# Patient Record
Sex: Female | Born: 1950 | Race: White | Hispanic: No | Marital: Married | Smoking: Former smoker
Health system: Southern US, Community
[De-identification: ages and names within clinical notes are randomized; demographics above are authoritative.]

## PROBLEM LIST (undated history)

## (undated) DIAGNOSIS — K219 Gastro-esophageal reflux disease without esophagitis: Secondary | ICD-10-CM

## (undated) DIAGNOSIS — E119 Type 2 diabetes mellitus without complications: Secondary | ICD-10-CM

## (undated) DIAGNOSIS — E039 Hypothyroidism, unspecified: Secondary | ICD-10-CM

## (undated) DIAGNOSIS — C4499 Other specified malignant neoplasm of skin, unspecified: Secondary | ICD-10-CM

## (undated) HISTORY — PX: WRIST SURGERY: SHX841

## (undated) HISTORY — PX: CATARACT EXTRACTION, BILATERAL: SHX1313

## (undated) HISTORY — PX: OTHER SURGICAL HISTORY: SHX169

---

## 1999-06-24 ENCOUNTER — Ambulatory Visit (HOSPITAL_COMMUNITY): Admission: RE | Admit: 1999-06-24 | Discharge: 1999-06-24 | Payer: Self-pay | Admitting: Otolaryngology

## 1999-06-24 ENCOUNTER — Encounter: Payer: Self-pay | Admitting: Otolaryngology

## 1999-09-01 ENCOUNTER — Encounter: Payer: Self-pay | Admitting: *Deleted

## 1999-09-01 ENCOUNTER — Encounter: Admission: RE | Admit: 1999-09-01 | Discharge: 1999-09-01 | Payer: Self-pay | Admitting: *Deleted

## 2000-07-25 ENCOUNTER — Encounter: Admission: RE | Admit: 2000-07-25 | Discharge: 2000-07-25 | Payer: Self-pay | Admitting: Internal Medicine

## 2000-07-25 ENCOUNTER — Encounter: Payer: Self-pay | Admitting: Internal Medicine

## 2000-09-01 ENCOUNTER — Encounter: Payer: Self-pay | Admitting: Family Medicine

## 2000-09-01 ENCOUNTER — Encounter: Admission: RE | Admit: 2000-09-01 | Discharge: 2000-09-01 | Payer: Self-pay | Admitting: Family Medicine

## 2001-07-26 ENCOUNTER — Encounter: Admission: RE | Admit: 2001-07-26 | Discharge: 2001-07-26 | Payer: Self-pay | Admitting: Internal Medicine

## 2001-07-26 ENCOUNTER — Encounter: Payer: Self-pay | Admitting: Internal Medicine

## 2002-10-05 ENCOUNTER — Encounter: Admission: RE | Admit: 2002-10-05 | Discharge: 2002-10-05 | Payer: Self-pay | Admitting: Internal Medicine

## 2002-10-05 ENCOUNTER — Encounter: Payer: Self-pay | Admitting: Internal Medicine

## 2003-11-08 ENCOUNTER — Ambulatory Visit (HOSPITAL_COMMUNITY): Admission: RE | Admit: 2003-11-08 | Discharge: 2003-11-08 | Payer: Self-pay | Admitting: Internal Medicine

## 2004-07-30 ENCOUNTER — Other Ambulatory Visit: Admission: RE | Admit: 2004-07-30 | Discharge: 2004-07-30 | Payer: Self-pay | Admitting: Internal Medicine

## 2004-11-13 ENCOUNTER — Ambulatory Visit (HOSPITAL_COMMUNITY): Admission: RE | Admit: 2004-11-13 | Discharge: 2004-11-13 | Payer: Self-pay | Admitting: Internal Medicine

## 2005-12-03 ENCOUNTER — Ambulatory Visit (HOSPITAL_COMMUNITY): Admission: RE | Admit: 2005-12-03 | Discharge: 2005-12-03 | Payer: Self-pay | Admitting: Internal Medicine

## 2006-02-06 ENCOUNTER — Emergency Department: Payer: Self-pay | Admitting: Emergency Medicine

## 2006-02-07 ENCOUNTER — Ambulatory Visit: Payer: Self-pay | Admitting: Emergency Medicine

## 2006-10-24 ENCOUNTER — Encounter: Admission: RE | Admit: 2006-10-24 | Discharge: 2006-10-24 | Payer: Self-pay | Admitting: Internal Medicine

## 2006-12-16 ENCOUNTER — Ambulatory Visit (HOSPITAL_COMMUNITY): Admission: RE | Admit: 2006-12-16 | Discharge: 2006-12-16 | Payer: Self-pay | Admitting: Internal Medicine

## 2006-12-21 ENCOUNTER — Encounter: Admission: RE | Admit: 2006-12-21 | Discharge: 2006-12-21 | Payer: Self-pay | Admitting: Internal Medicine

## 2007-08-25 ENCOUNTER — Other Ambulatory Visit: Admission: RE | Admit: 2007-08-25 | Discharge: 2007-08-25 | Payer: Self-pay | Admitting: Internal Medicine

## 2008-01-03 ENCOUNTER — Ambulatory Visit (HOSPITAL_COMMUNITY): Admission: RE | Admit: 2008-01-03 | Discharge: 2008-01-03 | Payer: Self-pay | Admitting: Internal Medicine

## 2008-10-15 ENCOUNTER — Encounter: Admission: RE | Admit: 2008-10-15 | Discharge: 2008-10-15 | Payer: Self-pay | Admitting: Internal Medicine

## 2009-10-31 ENCOUNTER — Ambulatory Visit (HOSPITAL_COMMUNITY): Admission: RE | Admit: 2009-10-31 | Discharge: 2009-10-31 | Payer: Self-pay | Admitting: Internal Medicine

## 2010-10-28 ENCOUNTER — Other Ambulatory Visit (HOSPITAL_COMMUNITY)
Admission: RE | Admit: 2010-10-28 | Discharge: 2010-10-28 | Disposition: A | Discharge status: Home / Self Care | Payer: BC Managed Care – PPO | Source: Ambulatory Visit | Attending: Internal Medicine | Admitting: Internal Medicine

## 2010-10-28 ENCOUNTER — Other Ambulatory Visit: Payer: Self-pay | Admitting: Internal Medicine

## 2010-10-28 DIAGNOSIS — Z1159 Encounter for screening for other viral diseases: Secondary | ICD-10-CM | POA: Insufficient documentation

## 2010-10-28 DIAGNOSIS — R1011 Right upper quadrant pain: Secondary | ICD-10-CM

## 2010-10-28 DIAGNOSIS — Z01419 Encounter for gynecological examination (general) (routine) without abnormal findings: Secondary | ICD-10-CM | POA: Insufficient documentation

## 2010-10-30 ENCOUNTER — Ambulatory Visit
Admission: RE | Admit: 2010-10-30 | Discharge: 2010-10-30 | Disposition: A | Discharge status: Home / Self Care | Payer: BC Managed Care – PPO | Source: Ambulatory Visit | Attending: Internal Medicine | Admitting: Internal Medicine

## 2010-10-30 DIAGNOSIS — R1011 Right upper quadrant pain: Secondary | ICD-10-CM

## 2010-11-03 ENCOUNTER — Other Ambulatory Visit (HOSPITAL_COMMUNITY): Payer: Self-pay | Admitting: Internal Medicine

## 2010-11-03 DIAGNOSIS — Z1231 Encounter for screening mammogram for malignant neoplasm of breast: Secondary | ICD-10-CM

## 2010-11-13 ENCOUNTER — Ambulatory Visit (HOSPITAL_COMMUNITY)
Admission: RE | Admit: 2010-11-13 | Discharge: 2010-11-13 | Disposition: A | Discharge status: Home / Self Care | Payer: BC Managed Care – PPO | Source: Ambulatory Visit | Attending: Internal Medicine | Admitting: Internal Medicine

## 2010-11-13 DIAGNOSIS — Z1231 Encounter for screening mammogram for malignant neoplasm of breast: Secondary | ICD-10-CM | POA: Insufficient documentation

## 2010-12-01 ENCOUNTER — Other Ambulatory Visit: Payer: Self-pay | Admitting: Gastroenterology

## 2011-11-02 ENCOUNTER — Other Ambulatory Visit (HOSPITAL_COMMUNITY): Payer: Self-pay | Admitting: Internal Medicine

## 2011-11-02 DIAGNOSIS — Z1231 Encounter for screening mammogram for malignant neoplasm of breast: Secondary | ICD-10-CM

## 2011-11-26 ENCOUNTER — Ambulatory Visit (HOSPITAL_COMMUNITY)
Admission: RE | Admit: 2011-11-26 | Discharge: 2011-11-26 | Disposition: A | Discharge status: Home / Self Care | Payer: BC Managed Care – PPO | Source: Ambulatory Visit | Attending: Internal Medicine | Admitting: Internal Medicine

## 2011-11-26 DIAGNOSIS — Z1231 Encounter for screening mammogram for malignant neoplasm of breast: Secondary | ICD-10-CM

## 2011-12-28 ENCOUNTER — Other Ambulatory Visit: Payer: Self-pay | Admitting: Dermatology

## 2012-02-16 DIAGNOSIS — C519 Malignant neoplasm of vulva, unspecified: Secondary | ICD-10-CM

## 2012-02-16 HISTORY — DX: Malignant neoplasm of vulva, unspecified: C51.9

## 2012-05-17 ENCOUNTER — Other Ambulatory Visit: Payer: Self-pay | Admitting: Obstetrics and Gynecology

## 2012-11-23 ENCOUNTER — Other Ambulatory Visit (HOSPITAL_COMMUNITY): Payer: Self-pay | Admitting: Internal Medicine

## 2012-11-23 DIAGNOSIS — Z1231 Encounter for screening mammogram for malignant neoplasm of breast: Secondary | ICD-10-CM

## 2012-12-01 ENCOUNTER — Ambulatory Visit (HOSPITAL_COMMUNITY)
Admission: RE | Admit: 2012-12-01 | Discharge: 2012-12-01 | Disposition: A | Discharge status: Home / Self Care | Payer: 59 | Source: Ambulatory Visit | Attending: Internal Medicine | Admitting: Internal Medicine

## 2012-12-01 DIAGNOSIS — Z1231 Encounter for screening mammogram for malignant neoplasm of breast: Secondary | ICD-10-CM | POA: Insufficient documentation

## 2013-01-28 IMAGING — US US ABDOMEN COMPLETE
1 series · 13 of 25 positions shown · non-contrast
Comparison: CT abdomen and pelvis 10/24/2006 [HOSPITAL]
[HOSPITAL] and 10/18/2003 [REDACTED].

CLINICAL DATA: Episodic epigastric and right upper quadrant
abdominal pain associated with nausea and vomiting.

COMPLETE ABDOMINAL ULTRASOUND 10/30/2010:

[Series 1: us abdomen complete · 0.32mm/px · 13 of 82 slices shown]
[im 1/82]
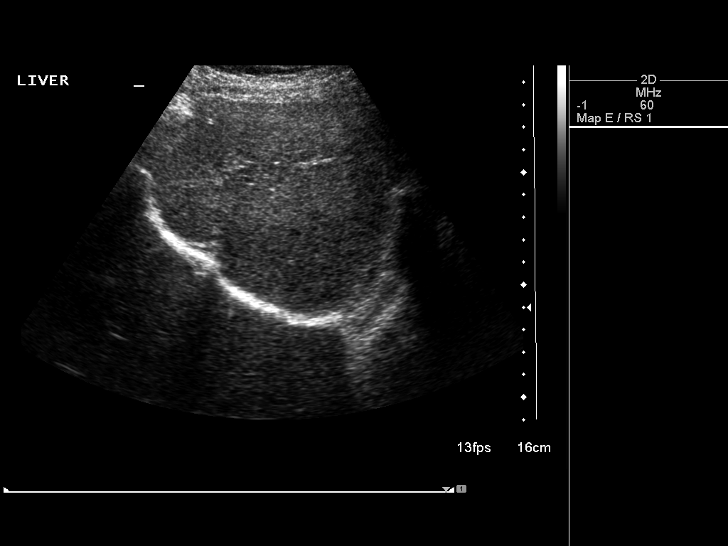
[im 7/82]
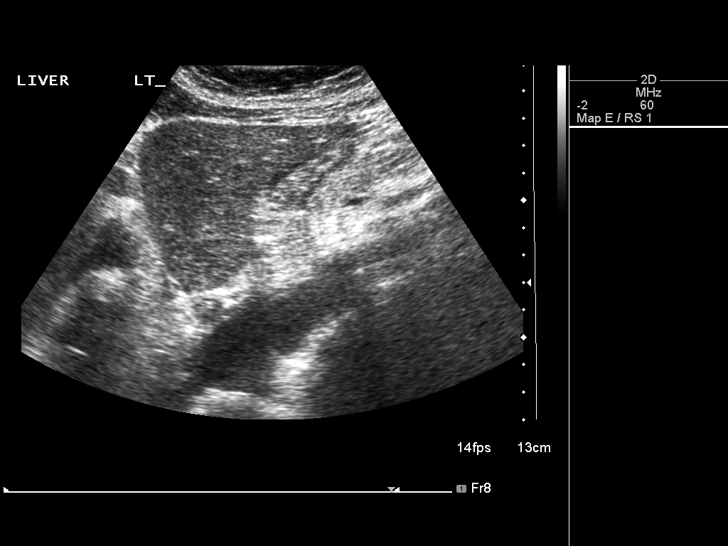
[im 14/82]
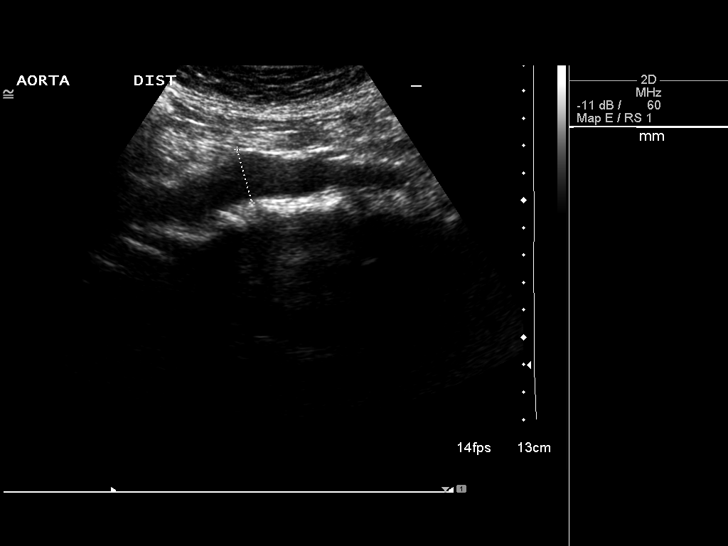
[im 21/82]
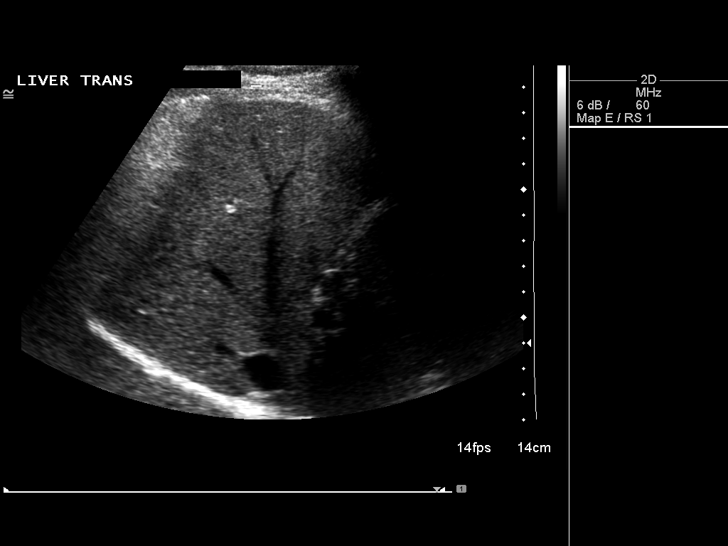
[im 28/82]
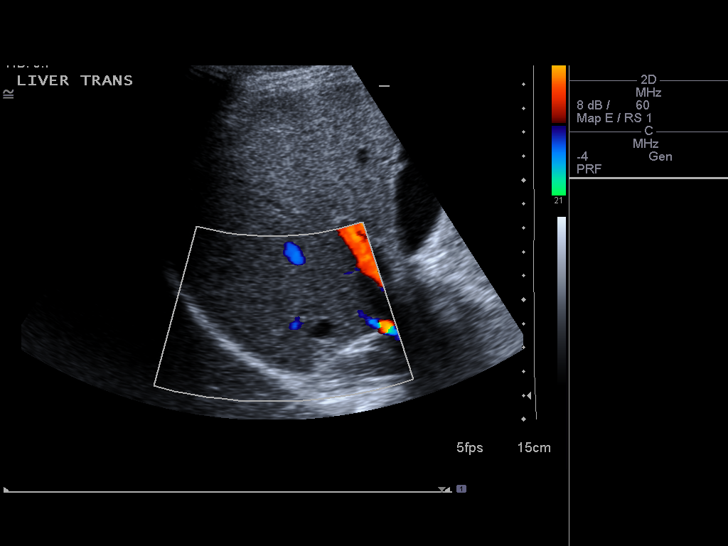
[im 34/82]
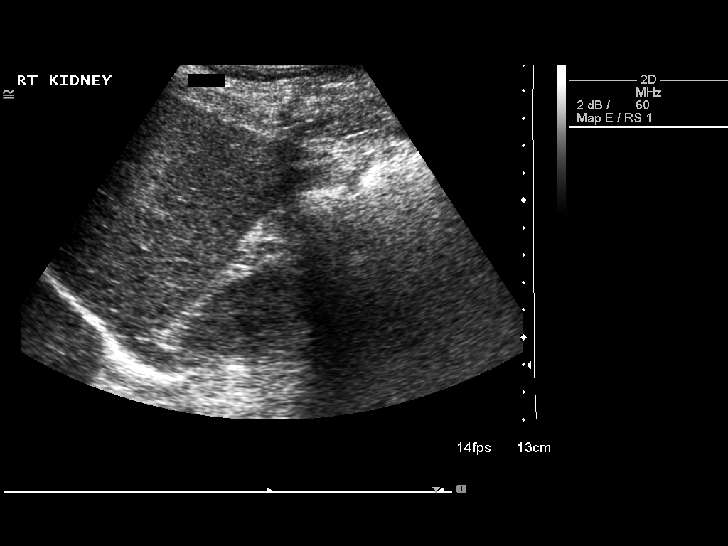
[im 41/82]
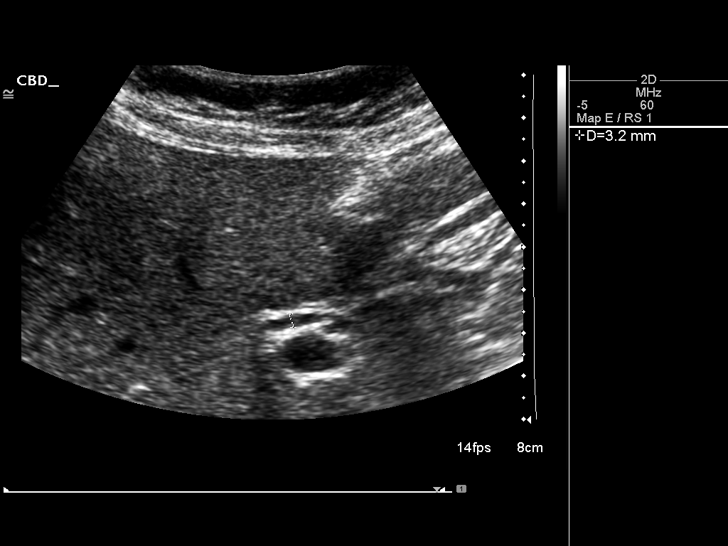
[im 48/82]
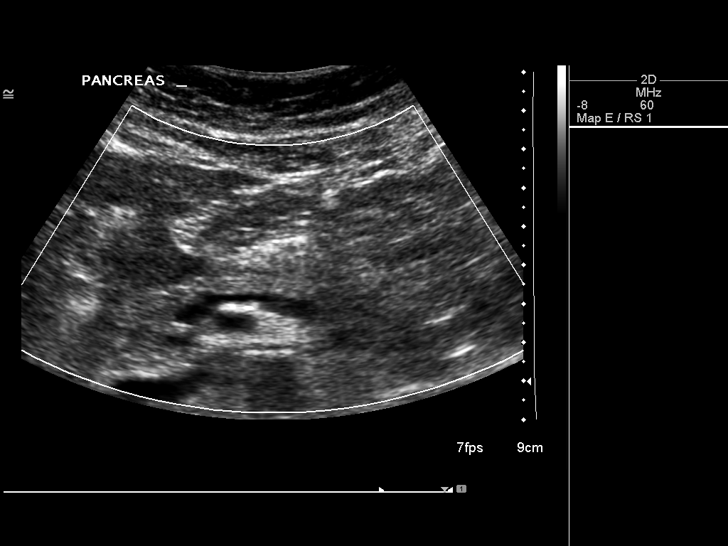
[im 55/82]
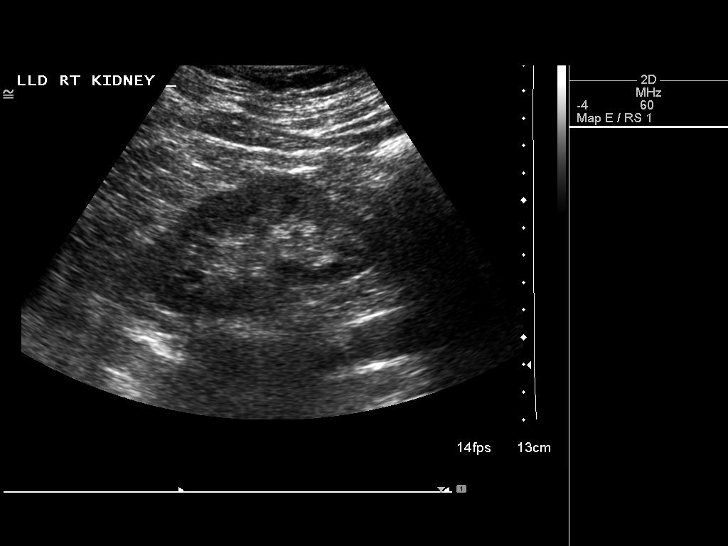
[im 61/82]
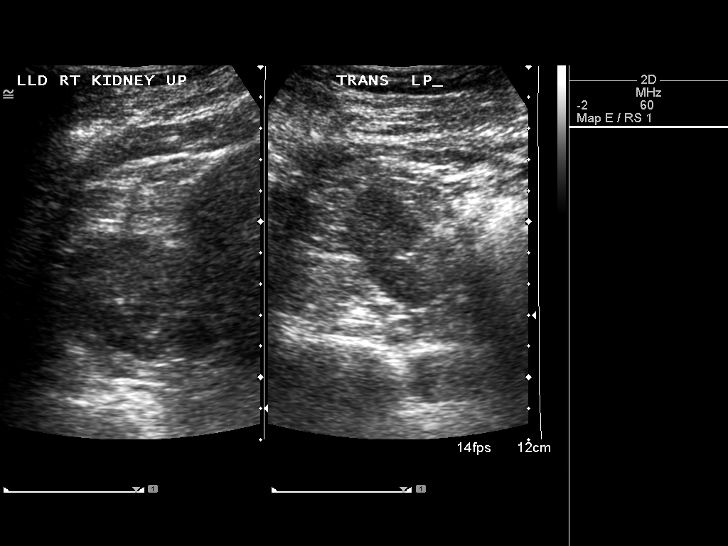
[im 68/82]
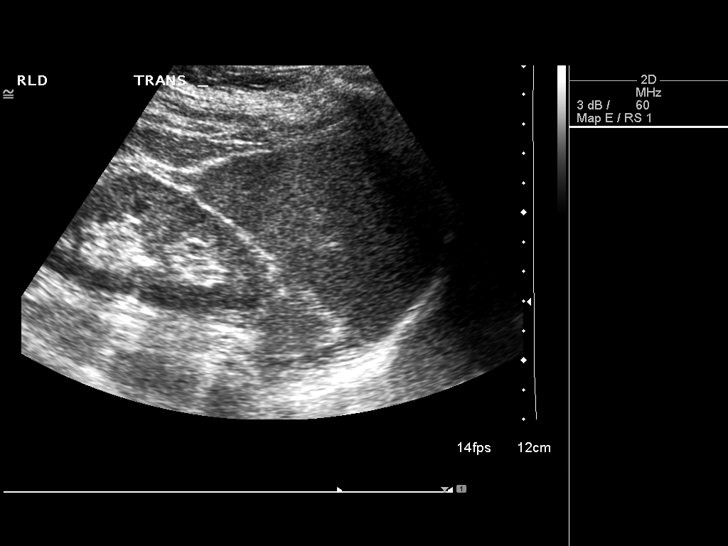
[im 75/82]
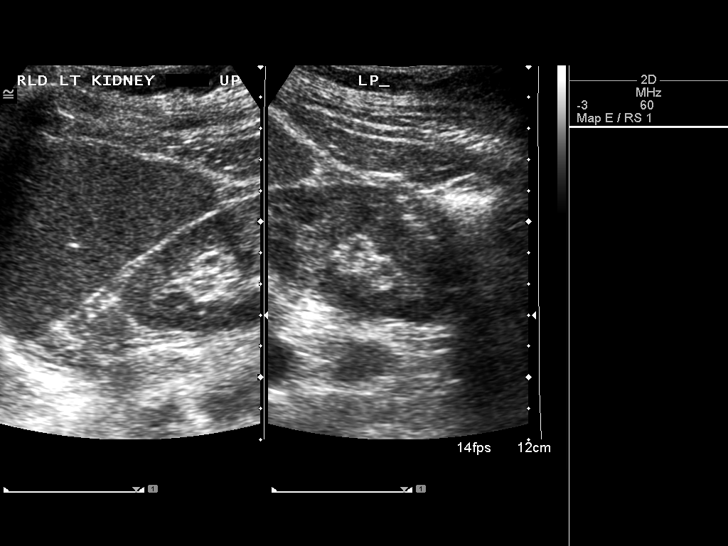
[im 82/82]
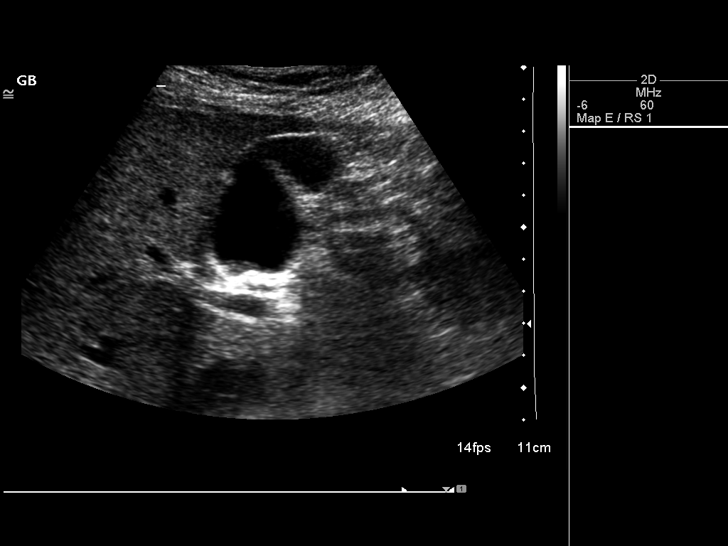

[13 of 25 positions shown; findings below may reference images not displayed]

FINDINGS: Gallbladder:  Approximate 3 mm independent nonshadowing echogenic
focus arising from the fundal wall.  No shadowing gallstones or
echogenic sludge.  No gallbladder wall thickening or
pericholecystic fluid.  Negative sonographic Murphy's sign
according to the ultrasound technologist.

Common bile duct:  Normal in caliber with maximum diameter
approximating 3 mm.

Liver:  Approximate 1 cm simple cyst involving the right lobe,
unchanged from the prior examinations.  No significant focal
hepatic parenchymal abnormalities.  Normal size and parenchymal
echotexture.  Patent portal vein with hepatopetal flow.

IVC:  Patent.

Pancreas:  Normal size and echotexture without focal parenchymal
abnormality.

Spleen:  Normal size and echotexture without focal parenchymal
abnormality.

Right Kidney:  No hydronephrosis.  Well-preserved cortex.  No
shadowing calculi.  Normal size and parenchymal echotexture without
focal abnormalities. Approximately 9.0 cm length.

Left Kidney:  No hydronephrosis.  Well-preserved cortex.  No
shadowing calculi.  Normal size and parenchymal echotexture without
focal abnormalities. Approximately 9.5 cm length.

Abdominal aorta:  Normal in caliber throughout its visualized
course in the abdomen with mild to moderate atherosclerosis.
IMPRESSION: 1.  Approximate 3 mm polyp involving the gallbladder fundus.  No
evidence of cholelithiasis or cholecystitis.
2.  Mild to moderate abdominal aortic atherosclerosis without
aneurysm.
3.  No significant abnormalities otherwise.

## 2013-12-17 ENCOUNTER — Other Ambulatory Visit (HOSPITAL_COMMUNITY): Payer: Self-pay | Admitting: Internal Medicine

## 2013-12-17 DIAGNOSIS — Z1231 Encounter for screening mammogram for malignant neoplasm of breast: Secondary | ICD-10-CM

## 2013-12-20 ENCOUNTER — Ambulatory Visit (HOSPITAL_COMMUNITY)
Admission: RE | Admit: 2013-12-20 | Discharge: 2013-12-20 | Disposition: A | Discharge status: Home / Self Care | Payer: 59 | Source: Ambulatory Visit | Attending: Internal Medicine | Admitting: Internal Medicine

## 2013-12-20 DIAGNOSIS — Z1231 Encounter for screening mammogram for malignant neoplasm of breast: Secondary | ICD-10-CM

## 2015-01-07 ENCOUNTER — Other Ambulatory Visit: Payer: Self-pay

## 2015-01-07 DIAGNOSIS — Z1231 Encounter for screening mammogram for malignant neoplasm of breast: Secondary | ICD-10-CM

## 2015-02-04 ENCOUNTER — Ambulatory Visit
Admission: RE | Admit: 2015-02-04 | Discharge: 2015-02-04 | Disposition: A | Discharge status: Home / Self Care | Payer: 59 | Source: Ambulatory Visit

## 2015-02-04 DIAGNOSIS — Z1231 Encounter for screening mammogram for malignant neoplasm of breast: Secondary | ICD-10-CM

## 2015-10-23 DIAGNOSIS — Z9889 Other specified postprocedural states: Secondary | ICD-10-CM | POA: Diagnosis not present

## 2015-10-23 DIAGNOSIS — Z8544 Personal history of malignant neoplasm of other female genital organs: Secondary | ICD-10-CM | POA: Diagnosis not present

## 2015-10-23 DIAGNOSIS — Z08 Encounter for follow-up examination after completed treatment for malignant neoplasm: Secondary | ICD-10-CM | POA: Diagnosis not present

## 2015-10-23 DIAGNOSIS — Z802 Family history of malignant neoplasm of other respiratory and intrathoracic organs: Secondary | ICD-10-CM | POA: Diagnosis not present

## 2015-10-23 DIAGNOSIS — Z808 Family history of malignant neoplasm of other organs or systems: Secondary | ICD-10-CM | POA: Diagnosis not present

## 2015-10-23 DIAGNOSIS — Z87891 Personal history of nicotine dependence: Secondary | ICD-10-CM | POA: Diagnosis not present

## 2015-10-23 DIAGNOSIS — K219 Gastro-esophageal reflux disease without esophagitis: Secondary | ICD-10-CM | POA: Diagnosis not present

## 2015-10-23 DIAGNOSIS — Z8042 Family history of malignant neoplasm of prostate: Secondary | ICD-10-CM | POA: Diagnosis not present

## 2015-10-30 DIAGNOSIS — R69 Illness, unspecified: Secondary | ICD-10-CM | POA: Diagnosis not present

## 2015-12-05 DIAGNOSIS — M8588 Other specified disorders of bone density and structure, other site: Secondary | ICD-10-CM | POA: Diagnosis not present

## 2015-12-05 DIAGNOSIS — C519 Malignant neoplasm of vulva, unspecified: Secondary | ICD-10-CM | POA: Diagnosis not present

## 2015-12-05 DIAGNOSIS — E782 Mixed hyperlipidemia: Secondary | ICD-10-CM | POA: Diagnosis not present

## 2015-12-05 DIAGNOSIS — R7303 Prediabetes: Secondary | ICD-10-CM | POA: Diagnosis not present

## 2015-12-05 DIAGNOSIS — Z1159 Encounter for screening for other viral diseases: Secondary | ICD-10-CM | POA: Diagnosis not present

## 2015-12-05 DIAGNOSIS — M889 Osteitis deformans of unspecified bone: Secondary | ICD-10-CM | POA: Diagnosis not present

## 2015-12-05 DIAGNOSIS — Z1389 Encounter for screening for other disorder: Secondary | ICD-10-CM | POA: Diagnosis not present

## 2015-12-05 DIAGNOSIS — Z0000 Encounter for general adult medical examination without abnormal findings: Secondary | ICD-10-CM | POA: Diagnosis not present

## 2015-12-05 DIAGNOSIS — K219 Gastro-esophageal reflux disease without esophagitis: Secondary | ICD-10-CM | POA: Diagnosis not present

## 2015-12-05 DIAGNOSIS — E039 Hypothyroidism, unspecified: Secondary | ICD-10-CM | POA: Diagnosis not present

## 2016-01-07 ENCOUNTER — Other Ambulatory Visit: Payer: Self-pay | Admitting: Gastroenterology

## 2016-01-19 ENCOUNTER — Other Ambulatory Visit: Payer: Self-pay | Admitting: Internal Medicine

## 2016-01-19 DIAGNOSIS — Z1231 Encounter for screening mammogram for malignant neoplasm of breast: Secondary | ICD-10-CM

## 2016-02-11 ENCOUNTER — Ambulatory Visit
Admission: RE | Admit: 2016-02-11 | Discharge: 2016-02-11 | Disposition: A | Discharge status: Home / Self Care | Payer: Medicare HMO | Source: Ambulatory Visit | Attending: Internal Medicine | Admitting: Internal Medicine

## 2016-02-11 DIAGNOSIS — Z1231 Encounter for screening mammogram for malignant neoplasm of breast: Secondary | ICD-10-CM

## 2016-03-01 ENCOUNTER — Ambulatory Visit (HOSPITAL_COMMUNITY)
Admission: RE | Admit: 2016-03-01 | Discharge: 2016-03-01 | Disposition: A | Discharge status: Home / Self Care | Payer: Medicare HMO | Source: Ambulatory Visit | Attending: Gastroenterology | Admitting: Gastroenterology

## 2016-03-01 ENCOUNTER — Ambulatory Visit (HOSPITAL_COMMUNITY): Payer: Medicare HMO | Admitting: Registered Nurse

## 2016-03-01 ENCOUNTER — Encounter (HOSPITAL_COMMUNITY): Payer: Self-pay

## 2016-03-01 ENCOUNTER — Encounter (HOSPITAL_COMMUNITY)
Admission: RE | Disposition: A | Discharge status: Home / Self Care | Payer: Self-pay | Source: Ambulatory Visit | Attending: Gastroenterology

## 2016-03-01 DIAGNOSIS — K219 Gastro-esophageal reflux disease without esophagitis: Secondary | ICD-10-CM | POA: Diagnosis not present

## 2016-03-01 DIAGNOSIS — E039 Hypothyroidism, unspecified: Secondary | ICD-10-CM | POA: Insufficient documentation

## 2016-03-01 DIAGNOSIS — E7800 Pure hypercholesterolemia, unspecified: Secondary | ICD-10-CM | POA: Insufficient documentation

## 2016-03-01 DIAGNOSIS — Z8544 Personal history of malignant neoplasm of other female genital organs: Secondary | ICD-10-CM | POA: Diagnosis not present

## 2016-03-01 DIAGNOSIS — K5730 Diverticulosis of large intestine without perforation or abscess without bleeding: Secondary | ICD-10-CM | POA: Diagnosis not present

## 2016-03-01 DIAGNOSIS — Z1211 Encounter for screening for malignant neoplasm of colon: Secondary | ICD-10-CM | POA: Insufficient documentation

## 2016-03-01 DIAGNOSIS — K562 Volvulus: Secondary | ICD-10-CM | POA: Diagnosis not present

## 2016-03-01 DIAGNOSIS — Z86010 Personal history of colonic polyps: Secondary | ICD-10-CM | POA: Insufficient documentation

## 2016-03-01 HISTORY — DX: Other specified malignant neoplasm of skin, unspecified: C44.99

## 2016-03-01 HISTORY — DX: Gastro-esophageal reflux disease without esophagitis: K21.9

## 2016-03-01 HISTORY — DX: Hypothyroidism, unspecified: E03.9

## 2016-03-01 HISTORY — PX: COLONOSCOPY WITH PROPOFOL: SHX5780

## 2016-03-01 SURGERY — COLONOSCOPY WITH PROPOFOL
Anesthesia: Monitor Anesthesia Care

## 2016-03-01 MED ORDER — LIDOCAINE 2% (20 MG/ML) 5 ML SYRINGE
INTRAMUSCULAR | Status: AC
Start: 2016-03-01 — End: 2016-03-01
  Filled 2016-03-01: qty 5

## 2016-03-01 MED ORDER — PROPOFOL 500 MG/50ML IV EMUL
INTRAVENOUS | Status: DC | PRN
  Administered 2016-03-01: 130 ug/kg/min via INTRAVENOUS

## 2016-03-01 MED ORDER — PROPOFOL 10 MG/ML IV BOLUS
INTRAVENOUS | Status: AC
  Filled 2016-03-01: qty 40

## 2016-03-01 MED ORDER — SODIUM CHLORIDE 0.9 % IV SOLN: INTRAVENOUS | Status: DC

## 2016-03-01 MED ORDER — PROPOFOL 10 MG/ML IV BOLUS
INTRAVENOUS | Status: DC | PRN
  Administered 2016-03-01: 30 mg via INTRAVENOUS
  Administered 2016-03-01 (×2): 20 mg via INTRAVENOUS

## 2016-03-01 MED ORDER — LACTATED RINGERS IV SOLN
INTRAVENOUS | Status: DC
  Administered 2016-03-01: 07:00:00 via INTRAVENOUS

## 2016-03-01 MED ORDER — PROPOFOL 10 MG/ML IV BOLUS
INTRAVENOUS | Status: AC
  Filled 2016-03-01: qty 20

## 2016-03-01 MED ORDER — LIDOCAINE 2% (20 MG/ML) 5 ML SYRINGE
INTRAMUSCULAR | Status: DC | PRN
  Administered 2016-03-01: 100 mg via INTRAVENOUS

## 2016-03-01 SURGICAL SUPPLY — 21 items

## 2016-03-01 NOTE — H&P (Signed)
Procedure: Surveillance colonoscopy. 12/01/2010 surveillance colonoscopy was performed with removal of small tubular adenomatous and hyperplastic colon polyps  History: The patient is a 66 year old female born [birth year]. She is scheduled to undergo a surveillance colonoscopy today.  Past medical history: Hypothyroidism. Vulvar cancer. Hypercholesterolemia. Ganglion cyst surgery. Bilateral cataract surgery.  Medication allergies: Nexium caused skin rash  Exam: The patient is alert and lying comfortably on the endoscopy stretcher. Abdomen soft and nontender to palpation. Lungs are clear to auscultation. Cardiac exam reveals a regular rhythm.  Plan: Proceed with surveillance colonoscopy

## 2016-03-01 NOTE — Anesthesia Postprocedure Evaluation (Signed)
Anesthesia Post Note  Patient: Christina Wolfe  Procedure(s) Performed: Procedure(s) (LRB): COLONOSCOPY WITH PROPOFOL (N/A)  Patient location during evaluation: Endoscopy Anesthesia Type: MAC Level of consciousness: awake and alert Pain management: pain level controlled Vital Signs Assessment: post-procedure vital signs reviewed and stable Respiratory status: spontaneous breathing, nonlabored ventilation, respiratory function stable and patient connected to nasal cannula oxygen Cardiovascular status: stable and blood pressure returned to baseline Anesthetic complications: no       Last Vitals:  Vitals:   03/01/16 0830 03/01/16 0840  BP: (!) 149/82 (!) 167/75  Pulse: 74 69  Resp: 19 17  Temp:      Last Pain:  Vitals:   03/01/16 0822  TempSrc:   PainSc: [place]

## 2016-03-01 NOTE — Anesthesia Preprocedure Evaluation (Addendum)
Anesthesia Evaluation  Patient identified by MRN, date of birth, ID band Patient awake    Reviewed: Allergy & Precautions, NPO status , Patient's Chart, lab work & pertinent test results  Airway Mallampati: I  TM Distance: >3 FB Neck ROM: Full    Dental  (+) Teeth Intact, Dental Advisory Given   Pulmonary former smoker,    breath sounds clear to auscultation       Cardiovascular negative cardio ROS   Rhythm:Regular Rate:Normal     Neuro/Psych negative neurological ROS  negative psych ROS   GI/Hepatic Neg liver ROS, GERD  Medicated,  Endo/Other  Hypothyroidism   Renal/GU negative Renal ROS  negative genitourinary   Musculoskeletal negative musculoskeletal ROS (+)   Abdominal   Peds negative pediatric ROS (+)  Hematology negative hematology ROS (+)   Anesthesia Other Findings   Reproductive/Obstetrics negative OB ROS                             Anesthesia Physical Anesthesia Plan  ASA: II  Anesthesia Plan: MAC   Post-op Pain Management:    Induction: Intravenous  Airway Management Planned: Natural Airway  Additional Equipment:   Intra-op Plan:   Post-operative Plan:   Informed Consent: I have reviewed the patients History and Physical, chart, labs and discussed the procedure including the risks, benefits and alternatives for the proposed anesthesia with the patient or authorized representative who has indicated his/her understanding and acceptance.     Plan Discussed with: CRNA  Anesthesia Plan Comments:         Anesthesia Quick Evaluation

## 2016-03-01 NOTE — Discharge Instructions (Signed)

## 2016-03-01 NOTE — Transfer of Care (Signed)
Immediate Anesthesia Transfer of Care Note  Patient: Christina Wolfe  Procedure(s) Performed: Procedure(s): COLONOSCOPY WITH PROPOFOL (N/A)  Patient Location: PACU and Endoscopy Unit  Anesthesia Type:MAC  Level of Consciousness: awake, alert , oriented and patient cooperative  Airway & Oxygen Therapy: Patient Spontanous Breathing and Patient connected to face mask oxygen  Post-op Assessment: Report given to RN, Post -op Vital signs reviewed and stable and Patient moving all extremities  Post vital signs: Reviewed and stable  Last Vitals:  Vitals:   03/01/16 0657  BP: (!) 166/91  Pulse: 77  Resp: 17  Temp: 36.7 C    Last Pain:  Vitals:   03/01/16 0657  TempSrc: Oral         Complications: No apparent anesthesia complications

## 2016-03-01 NOTE — Op Note (Signed)
[facility] Patient Name: Christina Wolfe Procedure Date: 03/01/2016 MRN: [id number] Attending MD: Garlan Fair , MD Date of Birth: 06-26-1950 CSN: [id number] Age: 66 Admit Type: Outpatient Procedure:                Colonoscopy Indications:              High risk colon cancer surveillance: Personal                            history of non-advanced adenoma Providers:                Garlan Fair, MD, Laverta Baltimore RN, RN, Alfonso Patten, Technician, Courtney Heys. [place], CRNA Referring MD:              Medicines:                Propofol per Anesthesia Complications:            No immediate complications. Estimated Blood Loss:     Estimated blood loss: none. Procedure:                Pre-Anesthesia Assessment:                           - Prior to the procedure, a History and Physical                            was performed, and patient medications and                            allergies were reviewed. The patient's tolerance of                            previous anesthesia was also reviewed. The risks                            and benefits of the procedure and the sedation                            options and risks were discussed with the patient.                            All questions were answered, and informed consent                            was obtained. Prior Anticoagulants: The patient has                            taken no previous anticoagulant or antiplatelet                            agents. ASA Grade Assessment: II - A patient with  mild systemic disease. After reviewing the risks                            and benefits, the patient was deemed in                            satisfactory condition to undergo the procedure.                           After obtaining informed consent, the colonoscope                            was passed under direct vision. Throughout the      procedure, the patient's blood pressure, pulse, and                            oxygen saturations were monitored continuously. The                            Colonoscope was introduced through the anus and                            advanced to the the cecum, identified by                            appendiceal orifice and ileocecal valve. The                            colonoscopy was somewhat difficult due to                            significant looping in the sigmoid colon. The                            patient tolerated the procedure well. The quality                            of the bowel preparation was good. The appendiceal                            orifice and the rectum were photographed. Findings:      The perianal and digital rectal examinations were normal. Left colonic       diverticulosis was present.      The entire examined colon appeared normal. Impression:               - The entire examined colon is normal.                           - No specimens collected. Moderate Sedation:      N/A- Per Anesthesia Care Recommendation:           - Patient has a contact number available for  emergencies. The signs and symptoms of potential                            delayed complications were discussed with the                            patient. Return to normal activities tomorrow.                            Written discharge instructions were provided to the                            patient.                           - Repeat colonoscopy in 5 years for surveillance.                           - Resume previous diet.                           - Continue present medications. Procedure Code(s):        --- Professional ---                           [id number], Colorectal cancer screening; colonoscopy on                            individual at high risk Diagnosis Code(s):        --- Professional ---                           Z86.010, Personal history of  colonic polyps CPT copyright 2016 [employer]. All rights reserved. The codes documented in this report are preliminary and upon coder review may  be revised to meet current compliance requirements. Earle Gell, MD Garlan Fair, MD 03/01/2016 8:18:09 AM This report has been signed electronically. Number of Addenda: 0

## 2016-03-03 ENCOUNTER — Encounter (HOSPITAL_COMMUNITY): Payer: Self-pay | Admitting: Gastroenterology

## 2016-05-04 DIAGNOSIS — R69 Illness, unspecified: Secondary | ICD-10-CM | POA: Diagnosis not present

## 2016-07-16 NOTE — Addendum Note (Signed)
Addendum  created 07/16/16 0902 by Effie Berkshire, MD   Sign clinical note

## 2016-07-16 NOTE — Anesthesia Postprocedure Evaluation (Signed)
Anesthesia Post Note  Patient: Christina Wolfe  Procedure(s) Performed: Procedure(s) (LRB): COLONOSCOPY WITH PROPOFOL (N/A)     Anesthesia Post Evaluation  Last Vitals:  Vitals:   03/01/16 0830 03/01/16 0840  BP: (!) 149/82 (!) 167/75  Pulse: 74 69  Resp: 19 17  Temp:      Last Pain:  Vitals:   03/02/16 0945  TempSrc:   PainSc: 0-No pain                 Effie Berkshire

## 2016-11-04 DIAGNOSIS — Z90710 Acquired absence of both cervix and uterus: Secondary | ICD-10-CM | POA: Diagnosis not present

## 2016-11-04 DIAGNOSIS — Z8544 Personal history of malignant neoplasm of other female genital organs: Secondary | ICD-10-CM | POA: Diagnosis not present

## 2016-11-04 DIAGNOSIS — Z801 Family history of malignant neoplasm of trachea, bronchus and lung: Secondary | ICD-10-CM | POA: Diagnosis not present

## 2016-11-04 DIAGNOSIS — Z87891 Personal history of nicotine dependence: Secondary | ICD-10-CM | POA: Diagnosis not present

## 2016-11-04 DIAGNOSIS — Z08 Encounter for follow-up examination after completed treatment for malignant neoplasm: Secondary | ICD-10-CM | POA: Diagnosis not present

## 2016-11-04 DIAGNOSIS — C4499 Other specified malignant neoplasm of skin, unspecified: Secondary | ICD-10-CM | POA: Diagnosis not present

## 2016-11-04 DIAGNOSIS — Z808 Family history of malignant neoplasm of other organs or systems: Secondary | ICD-10-CM | POA: Diagnosis not present

## 2016-11-04 DIAGNOSIS — Z8042 Family history of malignant neoplasm of prostate: Secondary | ICD-10-CM | POA: Diagnosis not present

## 2016-11-04 DIAGNOSIS — Z9889 Other specified postprocedural states: Secondary | ICD-10-CM | POA: Diagnosis not present

## 2016-11-15 DIAGNOSIS — R69 Illness, unspecified: Secondary | ICD-10-CM | POA: Diagnosis not present

## 2016-12-08 DIAGNOSIS — R739 Hyperglycemia, unspecified: Secondary | ICD-10-CM | POA: Diagnosis not present

## 2016-12-08 DIAGNOSIS — M889 Osteitis deformans of unspecified bone: Secondary | ICD-10-CM | POA: Diagnosis not present

## 2016-12-08 DIAGNOSIS — Z1389 Encounter for screening for other disorder: Secondary | ICD-10-CM | POA: Diagnosis not present

## 2016-12-08 DIAGNOSIS — N183 Chronic kidney disease, stage 3 (moderate): Secondary | ICD-10-CM | POA: Diagnosis not present

## 2016-12-08 DIAGNOSIS — E039 Hypothyroidism, unspecified: Secondary | ICD-10-CM | POA: Diagnosis not present

## 2016-12-08 DIAGNOSIS — C519 Malignant neoplasm of vulva, unspecified: Secondary | ICD-10-CM | POA: Diagnosis not present

## 2016-12-08 DIAGNOSIS — E782 Mixed hyperlipidemia: Secondary | ICD-10-CM | POA: Diagnosis not present

## 2016-12-08 DIAGNOSIS — K219 Gastro-esophageal reflux disease without esophagitis: Secondary | ICD-10-CM | POA: Diagnosis not present

## 2016-12-08 DIAGNOSIS — M8588 Other specified disorders of bone density and structure, other site: Secondary | ICD-10-CM | POA: Diagnosis not present

## 2016-12-08 DIAGNOSIS — Z0000 Encounter for general adult medical examination without abnormal findings: Secondary | ICD-10-CM | POA: Diagnosis not present

## 2017-01-11 ENCOUNTER — Other Ambulatory Visit: Payer: Self-pay | Admitting: Internal Medicine

## 2017-01-11 DIAGNOSIS — Z1231 Encounter for screening mammogram for malignant neoplasm of breast: Secondary | ICD-10-CM

## 2017-02-24 ENCOUNTER — Ambulatory Visit
Admission: RE | Admit: 2017-02-24 | Discharge: 2017-02-24 | Disposition: A | Discharge status: Home / Self Care | Payer: Medicare HMO | Source: Ambulatory Visit | Attending: Internal Medicine | Admitting: Internal Medicine

## 2017-02-24 DIAGNOSIS — Z1231 Encounter for screening mammogram for malignant neoplasm of breast: Secondary | ICD-10-CM | POA: Diagnosis not present

## 2017-03-03 DIAGNOSIS — M8588 Other specified disorders of bone density and structure, other site: Secondary | ICD-10-CM | POA: Diagnosis not present

## 2017-05-30 DIAGNOSIS — R69 Illness, unspecified: Secondary | ICD-10-CM | POA: Diagnosis not present

## 2017-11-17 DIAGNOSIS — R69 Illness, unspecified: Secondary | ICD-10-CM | POA: Diagnosis not present

## 2017-12-08 DIAGNOSIS — R69 Illness, unspecified: Secondary | ICD-10-CM | POA: Diagnosis not present

## 2017-12-23 DIAGNOSIS — E782 Mixed hyperlipidemia: Secondary | ICD-10-CM | POA: Diagnosis not present

## 2017-12-23 DIAGNOSIS — K219 Gastro-esophageal reflux disease without esophagitis: Secondary | ICD-10-CM | POA: Diagnosis not present

## 2017-12-23 DIAGNOSIS — N182 Chronic kidney disease, stage 2 (mild): Secondary | ICD-10-CM | POA: Diagnosis not present

## 2017-12-23 DIAGNOSIS — M8588 Other specified disorders of bone density and structure, other site: Secondary | ICD-10-CM | POA: Diagnosis not present

## 2017-12-23 DIAGNOSIS — E039 Hypothyroidism, unspecified: Secondary | ICD-10-CM | POA: Diagnosis not present

## 2017-12-23 DIAGNOSIS — R7303 Prediabetes: Secondary | ICD-10-CM | POA: Diagnosis not present

## 2017-12-23 DIAGNOSIS — Z0000 Encounter for general adult medical examination without abnormal findings: Secondary | ICD-10-CM | POA: Diagnosis not present

## 2017-12-23 DIAGNOSIS — Z1389 Encounter for screening for other disorder: Secondary | ICD-10-CM | POA: Diagnosis not present

## 2017-12-23 DIAGNOSIS — C519 Malignant neoplasm of vulva, unspecified: Secondary | ICD-10-CM | POA: Diagnosis not present

## 2018-04-03 ENCOUNTER — Other Ambulatory Visit: Payer: Self-pay | Admitting: Internal Medicine

## 2018-04-03 DIAGNOSIS — Z1231 Encounter for screening mammogram for malignant neoplasm of breast: Secondary | ICD-10-CM

## 2018-04-13 ENCOUNTER — Ambulatory Visit
Admission: RE | Admit: 2018-04-13 | Discharge: 2018-04-13 | Disposition: A | Discharge status: Home / Self Care | Payer: Medicare HMO | Source: Ambulatory Visit | Attending: Internal Medicine | Admitting: Internal Medicine

## 2018-04-13 DIAGNOSIS — Z1231 Encounter for screening mammogram for malignant neoplasm of breast: Secondary | ICD-10-CM | POA: Diagnosis not present

## 2018-08-09 DIAGNOSIS — E039 Hypothyroidism, unspecified: Secondary | ICD-10-CM | POA: Diagnosis not present

## 2018-08-09 DIAGNOSIS — E782 Mixed hyperlipidemia: Secondary | ICD-10-CM | POA: Diagnosis not present

## 2018-08-09 DIAGNOSIS — N182 Chronic kidney disease, stage 2 (mild): Secondary | ICD-10-CM | POA: Diagnosis not present

## 2018-08-09 DIAGNOSIS — N183 Chronic kidney disease, stage 3 (moderate): Secondary | ICD-10-CM | POA: Diagnosis not present

## 2018-11-20 DIAGNOSIS — R69 Illness, unspecified: Secondary | ICD-10-CM | POA: Diagnosis not present

## 2018-12-11 DIAGNOSIS — R69 Illness, unspecified: Secondary | ICD-10-CM | POA: Diagnosis not present

## 2019-01-03 DIAGNOSIS — R7303 Prediabetes: Secondary | ICD-10-CM | POA: Diagnosis not present

## 2019-01-03 DIAGNOSIS — N183 Chronic kidney disease, stage 3 unspecified: Secondary | ICD-10-CM | POA: Diagnosis not present

## 2019-01-03 DIAGNOSIS — E782 Mixed hyperlipidemia: Secondary | ICD-10-CM | POA: Diagnosis not present

## 2019-01-03 DIAGNOSIS — M889 Osteitis deformans of unspecified bone: Secondary | ICD-10-CM | POA: Diagnosis not present

## 2019-01-03 DIAGNOSIS — Z1389 Encounter for screening for other disorder: Secondary | ICD-10-CM | POA: Diagnosis not present

## 2019-01-03 DIAGNOSIS — M8588 Other specified disorders of bone density and structure, other site: Secondary | ICD-10-CM | POA: Diagnosis not present

## 2019-01-03 DIAGNOSIS — R7309 Other abnormal glucose: Secondary | ICD-10-CM | POA: Diagnosis not present

## 2019-01-03 DIAGNOSIS — Z0000 Encounter for general adult medical examination without abnormal findings: Secondary | ICD-10-CM | POA: Diagnosis not present

## 2019-01-03 DIAGNOSIS — C519 Malignant neoplasm of vulva, unspecified: Secondary | ICD-10-CM | POA: Diagnosis not present

## 2019-01-03 DIAGNOSIS — E039 Hypothyroidism, unspecified: Secondary | ICD-10-CM | POA: Diagnosis not present

## 2019-01-03 DIAGNOSIS — K219 Gastro-esophageal reflux disease without esophagitis: Secondary | ICD-10-CM | POA: Diagnosis not present

## 2019-01-22 DIAGNOSIS — U071 COVID-19: Secondary | ICD-10-CM | POA: Diagnosis not present

## 2019-03-28 DIAGNOSIS — E039 Hypothyroidism, unspecified: Secondary | ICD-10-CM | POA: Diagnosis not present

## 2019-04-04 DIAGNOSIS — N182 Chronic kidney disease, stage 2 (mild): Secondary | ICD-10-CM | POA: Diagnosis not present

## 2019-04-04 DIAGNOSIS — E039 Hypothyroidism, unspecified: Secondary | ICD-10-CM | POA: Diagnosis not present

## 2019-04-04 DIAGNOSIS — E782 Mixed hyperlipidemia: Secondary | ICD-10-CM | POA: Diagnosis not present

## 2019-04-10 ENCOUNTER — Other Ambulatory Visit: Payer: Self-pay | Admitting: Internal Medicine

## 2019-04-10 DIAGNOSIS — Z1231 Encounter for screening mammogram for malignant neoplasm of breast: Secondary | ICD-10-CM

## 2019-04-23 ENCOUNTER — Other Ambulatory Visit: Payer: Self-pay

## 2019-04-23 ENCOUNTER — Ambulatory Visit
Admission: RE | Admit: 2019-04-23 | Discharge: 2019-04-23 | Disposition: A | Discharge status: Home / Self Care | Payer: Medicare HMO | Source: Ambulatory Visit | Attending: Internal Medicine | Admitting: Internal Medicine

## 2019-04-23 DIAGNOSIS — Z1231 Encounter for screening mammogram for malignant neoplasm of breast: Secondary | ICD-10-CM | POA: Diagnosis not present

## 2019-06-18 DIAGNOSIS — R69 Illness, unspecified: Secondary | ICD-10-CM | POA: Diagnosis not present

## 2019-07-13 DIAGNOSIS — E039 Hypothyroidism, unspecified: Secondary | ICD-10-CM | POA: Diagnosis not present

## 2019-07-13 DIAGNOSIS — N182 Chronic kidney disease, stage 2 (mild): Secondary | ICD-10-CM | POA: Diagnosis not present

## 2019-07-13 DIAGNOSIS — E782 Mixed hyperlipidemia: Secondary | ICD-10-CM | POA: Diagnosis not present

## 2019-07-13 DIAGNOSIS — N183 Chronic kidney disease, stage 3 unspecified: Secondary | ICD-10-CM | POA: Diagnosis not present

## 2019-09-24 DIAGNOSIS — B029 Zoster without complications: Secondary | ICD-10-CM | POA: Diagnosis not present

## 2019-10-05 DIAGNOSIS — N182 Chronic kidney disease, stage 2 (mild): Secondary | ICD-10-CM | POA: Diagnosis not present

## 2019-10-05 DIAGNOSIS — N183 Chronic kidney disease, stage 3 unspecified: Secondary | ICD-10-CM | POA: Diagnosis not present

## 2019-10-05 DIAGNOSIS — E782 Mixed hyperlipidemia: Secondary | ICD-10-CM | POA: Diagnosis not present

## 2019-10-05 DIAGNOSIS — E039 Hypothyroidism, unspecified: Secondary | ICD-10-CM | POA: Diagnosis not present

## 2019-11-06 DIAGNOSIS — R69 Illness, unspecified: Secondary | ICD-10-CM | POA: Diagnosis not present

## 2019-12-13 DIAGNOSIS — E782 Mixed hyperlipidemia: Secondary | ICD-10-CM | POA: Diagnosis not present

## 2019-12-13 DIAGNOSIS — N183 Chronic kidney disease, stage 3 unspecified: Secondary | ICD-10-CM | POA: Diagnosis not present

## 2019-12-13 DIAGNOSIS — N182 Chronic kidney disease, stage 2 (mild): Secondary | ICD-10-CM | POA: Diagnosis not present

## 2019-12-13 DIAGNOSIS — E039 Hypothyroidism, unspecified: Secondary | ICD-10-CM | POA: Diagnosis not present

## 2019-12-24 DIAGNOSIS — R69 Illness, unspecified: Secondary | ICD-10-CM | POA: Diagnosis not present

## 2020-01-07 DIAGNOSIS — Z1389 Encounter for screening for other disorder: Secondary | ICD-10-CM | POA: Diagnosis not present

## 2020-01-07 DIAGNOSIS — Z0000 Encounter for general adult medical examination without abnormal findings: Secondary | ICD-10-CM | POA: Diagnosis not present

## 2020-01-07 DIAGNOSIS — M889 Osteitis deformans of unspecified bone: Secondary | ICD-10-CM | POA: Diagnosis not present

## 2020-01-07 DIAGNOSIS — K219 Gastro-esophageal reflux disease without esophagitis: Secondary | ICD-10-CM | POA: Diagnosis not present

## 2020-01-07 DIAGNOSIS — Z8544 Personal history of malignant neoplasm of other female genital organs: Secondary | ICD-10-CM | POA: Diagnosis not present

## 2020-01-07 DIAGNOSIS — N1831 Chronic kidney disease, stage 3a: Secondary | ICD-10-CM | POA: Diagnosis not present

## 2020-01-07 DIAGNOSIS — J310 Chronic rhinitis: Secondary | ICD-10-CM | POA: Diagnosis not present

## 2020-01-07 DIAGNOSIS — M8580 Other specified disorders of bone density and structure, unspecified site: Secondary | ICD-10-CM | POA: Diagnosis not present

## 2020-01-07 DIAGNOSIS — R7309 Other abnormal glucose: Secondary | ICD-10-CM | POA: Diagnosis not present

## 2020-01-07 DIAGNOSIS — E039 Hypothyroidism, unspecified: Secondary | ICD-10-CM | POA: Diagnosis not present

## 2020-01-07 DIAGNOSIS — E782 Mixed hyperlipidemia: Secondary | ICD-10-CM | POA: Diagnosis not present

## 2020-01-24 DIAGNOSIS — N183 Chronic kidney disease, stage 3 unspecified: Secondary | ICD-10-CM | POA: Diagnosis not present

## 2020-01-24 DIAGNOSIS — E039 Hypothyroidism, unspecified: Secondary | ICD-10-CM | POA: Diagnosis not present

## 2020-01-24 DIAGNOSIS — E782 Mixed hyperlipidemia: Secondary | ICD-10-CM | POA: Diagnosis not present

## 2020-01-24 DIAGNOSIS — K219 Gastro-esophageal reflux disease without esophagitis: Secondary | ICD-10-CM | POA: Diagnosis not present

## 2020-01-24 DIAGNOSIS — N182 Chronic kidney disease, stage 2 (mild): Secondary | ICD-10-CM | POA: Diagnosis not present

## 2020-04-04 DIAGNOSIS — N182 Chronic kidney disease, stage 2 (mild): Secondary | ICD-10-CM | POA: Diagnosis not present

## 2020-04-04 DIAGNOSIS — E782 Mixed hyperlipidemia: Secondary | ICD-10-CM | POA: Diagnosis not present

## 2020-04-04 DIAGNOSIS — N183 Chronic kidney disease, stage 3 unspecified: Secondary | ICD-10-CM | POA: Diagnosis not present

## 2020-04-04 DIAGNOSIS — K219 Gastro-esophageal reflux disease without esophagitis: Secondary | ICD-10-CM | POA: Diagnosis not present

## 2020-04-04 DIAGNOSIS — E039 Hypothyroidism, unspecified: Secondary | ICD-10-CM | POA: Diagnosis not present

## 2020-05-05 DIAGNOSIS — K219 Gastro-esophageal reflux disease without esophagitis: Secondary | ICD-10-CM | POA: Diagnosis not present

## 2020-05-05 DIAGNOSIS — N183 Chronic kidney disease, stage 3 unspecified: Secondary | ICD-10-CM | POA: Diagnosis not present

## 2020-05-05 DIAGNOSIS — E039 Hypothyroidism, unspecified: Secondary | ICD-10-CM | POA: Diagnosis not present

## 2020-05-05 DIAGNOSIS — E782 Mixed hyperlipidemia: Secondary | ICD-10-CM | POA: Diagnosis not present

## 2020-05-19 ENCOUNTER — Other Ambulatory Visit: Payer: Self-pay | Admitting: Internal Medicine

## 2020-05-19 DIAGNOSIS — Z1231 Encounter for screening mammogram for malignant neoplasm of breast: Secondary | ICD-10-CM

## 2020-06-25 DIAGNOSIS — E782 Mixed hyperlipidemia: Secondary | ICD-10-CM | POA: Diagnosis not present

## 2020-06-25 DIAGNOSIS — N183 Chronic kidney disease, stage 3 unspecified: Secondary | ICD-10-CM | POA: Diagnosis not present

## 2020-06-25 DIAGNOSIS — K219 Gastro-esophageal reflux disease without esophagitis: Secondary | ICD-10-CM | POA: Diagnosis not present

## 2020-06-25 DIAGNOSIS — E039 Hypothyroidism, unspecified: Secondary | ICD-10-CM | POA: Diagnosis not present

## 2020-07-08 ENCOUNTER — Ambulatory Visit
Admission: RE | Admit: 2020-07-08 | Discharge: 2020-07-08 | Disposition: A | Discharge status: Home / Self Care | Payer: Medicare HMO | Source: Ambulatory Visit | Attending: Internal Medicine | Admitting: Internal Medicine

## 2020-07-08 ENCOUNTER — Other Ambulatory Visit: Payer: Self-pay

## 2020-07-08 DIAGNOSIS — Z1231 Encounter for screening mammogram for malignant neoplasm of breast: Secondary | ICD-10-CM | POA: Diagnosis not present

## 2020-12-08 DIAGNOSIS — E039 Hypothyroidism, unspecified: Secondary | ICD-10-CM | POA: Diagnosis not present

## 2020-12-08 DIAGNOSIS — N183 Chronic kidney disease, stage 3 unspecified: Secondary | ICD-10-CM | POA: Diagnosis not present

## 2020-12-08 DIAGNOSIS — K219 Gastro-esophageal reflux disease without esophagitis: Secondary | ICD-10-CM | POA: Diagnosis not present

## 2020-12-08 DIAGNOSIS — E782 Mixed hyperlipidemia: Secondary | ICD-10-CM | POA: Diagnosis not present

## 2021-01-12 DIAGNOSIS — R7303 Prediabetes: Secondary | ICD-10-CM | POA: Diagnosis not present

## 2021-01-12 DIAGNOSIS — L659 Nonscarring hair loss, unspecified: Secondary | ICD-10-CM | POA: Diagnosis not present

## 2021-01-12 DIAGNOSIS — Z23 Encounter for immunization: Secondary | ICD-10-CM | POA: Diagnosis not present

## 2021-01-12 DIAGNOSIS — M889 Osteitis deformans of unspecified bone: Secondary | ICD-10-CM | POA: Diagnosis not present

## 2021-01-12 DIAGNOSIS — Z1331 Encounter for screening for depression: Secondary | ICD-10-CM | POA: Diagnosis not present

## 2021-01-12 DIAGNOSIS — E782 Mixed hyperlipidemia: Secondary | ICD-10-CM | POA: Diagnosis not present

## 2021-01-12 DIAGNOSIS — Z0000 Encounter for general adult medical examination without abnormal findings: Secondary | ICD-10-CM | POA: Diagnosis not present

## 2021-01-12 DIAGNOSIS — C519 Malignant neoplasm of vulva, unspecified: Secondary | ICD-10-CM | POA: Diagnosis not present

## 2021-01-12 DIAGNOSIS — R030 Elevated blood-pressure reading, without diagnosis of hypertension: Secondary | ICD-10-CM | POA: Diagnosis not present

## 2021-01-12 DIAGNOSIS — J310 Chronic rhinitis: Secondary | ICD-10-CM | POA: Diagnosis not present

## 2021-01-12 DIAGNOSIS — Z1389 Encounter for screening for other disorder: Secondary | ICD-10-CM | POA: Diagnosis not present

## 2021-01-12 DIAGNOSIS — K219 Gastro-esophageal reflux disease without esophagitis: Secondary | ICD-10-CM | POA: Diagnosis not present

## 2021-01-12 DIAGNOSIS — E039 Hypothyroidism, unspecified: Secondary | ICD-10-CM | POA: Diagnosis not present

## 2021-01-12 DIAGNOSIS — N1831 Chronic kidney disease, stage 3a: Secondary | ICD-10-CM | POA: Diagnosis not present

## 2021-02-24 DIAGNOSIS — L650 Telogen effluvium: Secondary | ICD-10-CM | POA: Diagnosis not present

## 2021-02-24 DIAGNOSIS — D225 Melanocytic nevi of trunk: Secondary | ICD-10-CM | POA: Diagnosis not present

## 2021-02-24 DIAGNOSIS — D485 Neoplasm of uncertain behavior of skin: Secondary | ICD-10-CM | POA: Diagnosis not present

## 2021-02-24 DIAGNOSIS — L814 Other melanin hyperpigmentation: Secondary | ICD-10-CM | POA: Diagnosis not present

## 2021-02-24 DIAGNOSIS — L821 Other seborrheic keratosis: Secondary | ICD-10-CM | POA: Diagnosis not present

## 2021-03-04 DIAGNOSIS — L650 Telogen effluvium: Secondary | ICD-10-CM | POA: Diagnosis not present

## 2021-05-26 DIAGNOSIS — E559 Vitamin D deficiency, unspecified: Secondary | ICD-10-CM | POA: Diagnosis not present

## 2021-05-26 DIAGNOSIS — L648 Other androgenic alopecia: Secondary | ICD-10-CM | POA: Diagnosis not present

## 2021-05-26 DIAGNOSIS — L650 Telogen effluvium: Secondary | ICD-10-CM | POA: Diagnosis not present

## 2021-06-19 ENCOUNTER — Other Ambulatory Visit: Payer: Self-pay | Admitting: Internal Medicine

## 2021-06-19 DIAGNOSIS — Z1231 Encounter for screening mammogram for malignant neoplasm of breast: Secondary | ICD-10-CM

## 2021-07-08 DIAGNOSIS — E559 Vitamin D deficiency, unspecified: Secondary | ICD-10-CM | POA: Diagnosis not present

## 2021-07-08 DIAGNOSIS — Z23 Encounter for immunization: Secondary | ICD-10-CM | POA: Diagnosis not present

## 2021-07-08 DIAGNOSIS — E039 Hypothyroidism, unspecified: Secondary | ICD-10-CM | POA: Diagnosis not present

## 2021-07-08 DIAGNOSIS — E782 Mixed hyperlipidemia: Secondary | ICD-10-CM | POA: Diagnosis not present

## 2021-07-08 DIAGNOSIS — R7303 Prediabetes: Secondary | ICD-10-CM | POA: Diagnosis not present

## 2021-07-08 DIAGNOSIS — N1831 Chronic kidney disease, stage 3a: Secondary | ICD-10-CM | POA: Diagnosis not present

## 2021-07-08 DIAGNOSIS — Z8544 Personal history of malignant neoplasm of other female genital organs: Secondary | ICD-10-CM | POA: Diagnosis not present

## 2021-07-08 DIAGNOSIS — K219 Gastro-esophageal reflux disease without esophagitis: Secondary | ICD-10-CM | POA: Diagnosis not present

## 2021-07-09 ENCOUNTER — Ambulatory Visit
Admission: RE | Admit: 2021-07-09 | Discharge: 2021-07-09 | Disposition: A | Discharge status: Home / Self Care | Payer: Medicare HMO | Source: Ambulatory Visit | Attending: Internal Medicine | Admitting: Internal Medicine

## 2021-07-09 DIAGNOSIS — Z1231 Encounter for screening mammogram for malignant neoplasm of breast: Secondary | ICD-10-CM

## 2021-09-09 DIAGNOSIS — E039 Hypothyroidism, unspecified: Secondary | ICD-10-CM | POA: Diagnosis not present

## 2022-01-15 DIAGNOSIS — E039 Hypothyroidism, unspecified: Secondary | ICD-10-CM | POA: Diagnosis not present

## 2022-01-15 DIAGNOSIS — E559 Vitamin D deficiency, unspecified: Secondary | ICD-10-CM | POA: Diagnosis not present

## 2022-01-15 DIAGNOSIS — K219 Gastro-esophageal reflux disease without esophagitis: Secondary | ICD-10-CM | POA: Diagnosis not present

## 2022-01-15 DIAGNOSIS — Z0000 Encounter for general adult medical examination without abnormal findings: Secondary | ICD-10-CM | POA: Diagnosis not present

## 2022-01-15 DIAGNOSIS — J310 Chronic rhinitis: Secondary | ICD-10-CM | POA: Diagnosis not present

## 2022-01-15 DIAGNOSIS — M519 Unspecified thoracic, thoracolumbar and lumbosacral intervertebral disc disorder: Secondary | ICD-10-CM | POA: Diagnosis not present

## 2022-01-15 DIAGNOSIS — E782 Mixed hyperlipidemia: Secondary | ICD-10-CM | POA: Diagnosis not present

## 2022-01-15 DIAGNOSIS — M8588 Other specified disorders of bone density and structure, other site: Secondary | ICD-10-CM | POA: Diagnosis not present

## 2022-01-15 DIAGNOSIS — R7303 Prediabetes: Secondary | ICD-10-CM | POA: Diagnosis not present

## 2022-01-15 DIAGNOSIS — M889 Osteitis deformans of unspecified bone: Secondary | ICD-10-CM | POA: Diagnosis not present

## 2022-01-15 DIAGNOSIS — N1831 Chronic kidney disease, stage 3a: Secondary | ICD-10-CM | POA: Diagnosis not present

## 2022-01-15 DIAGNOSIS — Z1331 Encounter for screening for depression: Secondary | ICD-10-CM | POA: Diagnosis not present

## 2022-06-22 ENCOUNTER — Other Ambulatory Visit: Payer: Self-pay | Admitting: Internal Medicine

## 2022-06-22 DIAGNOSIS — Z1231 Encounter for screening mammogram for malignant neoplasm of breast: Secondary | ICD-10-CM

## 2022-07-21 ENCOUNTER — Ambulatory Visit
Admission: RE | Admit: 2022-07-21 | Discharge: 2022-07-21 | Disposition: A | Discharge status: Home / Self Care | Payer: Medicare HMO | Source: Ambulatory Visit | Attending: Internal Medicine | Admitting: Internal Medicine

## 2022-07-21 DIAGNOSIS — Z1231 Encounter for screening mammogram for malignant neoplasm of breast: Secondary | ICD-10-CM | POA: Diagnosis not present

## 2022-08-11 DIAGNOSIS — R7303 Prediabetes: Secondary | ICD-10-CM | POA: Diagnosis not present

## 2022-10-10 ENCOUNTER — Ambulatory Visit (HOSPITAL_COMMUNITY)
Admission: EM | Admit: 2022-10-10 | Discharge: 2022-10-10 | Disposition: A | Discharge status: Home / Self Care | Payer: Medicare HMO | Attending: Family Medicine | Admitting: Family Medicine

## 2022-10-10 ENCOUNTER — Encounter (HOSPITAL_COMMUNITY): Payer: Self-pay

## 2022-10-10 ENCOUNTER — Ambulatory Visit (INDEPENDENT_AMBULATORY_CARE_PROVIDER_SITE_OTHER): Payer: Medicare HMO

## 2022-10-10 DIAGNOSIS — S92355A Nondisplaced fracture of fifth metatarsal bone, left foot, initial encounter for closed fracture: Secondary | ICD-10-CM | POA: Diagnosis not present

## 2022-10-10 DIAGNOSIS — T22112A Burn of first degree of left forearm, initial encounter: Secondary | ICD-10-CM | POA: Diagnosis not present

## 2022-10-10 DIAGNOSIS — S99922A Unspecified injury of left foot, initial encounter: Secondary | ICD-10-CM | POA: Diagnosis not present

## 2022-10-10 DIAGNOSIS — M79672 Pain in left foot: Secondary | ICD-10-CM | POA: Diagnosis not present

## 2022-10-10 HISTORY — DX: Type 2 diabetes mellitus without complications: E11.9

## 2022-10-10 MED ORDER — SILVER SULFADIAZINE 1 % EX CREA: 1.0000 | TOPICAL_CREAM | Freq: Every day | CUTANEOUS | 0 refills | Status: AC

## 2022-10-10 MED ORDER — HYDROCODONE-ACETAMINOPHEN 5-325 MG PO TABS: 1.0000 | ORAL_TABLET | Freq: Four times a day (QID) | ORAL | 0 refills | Status: AC | PRN

## 2022-10-10 NOTE — Discharge Instructions (Signed)
There is a fracture at that end of your fifth metatarsal bone in your foot.  Hydrocodone 5 mg--1 tablet every 6 hours as needed for pain.  This is best taken with food.  It can cause sleepiness or dizziness  Apply Silvadene 1-2 times daily to the burn
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
## 2022-10-10 NOTE — ED Triage Notes (Addendum)
Patient states she tripped on the threshold of her door and fell, rolling her left foot. Incident happened yesterday afternoon. Patient c/o left foot pain. Patient states she has been  icing her foot. Patient has bruising to her toes and under the toes as well.  Patient also took Tylenol 1000 mg at 0900 today.   Patient added after triage that she had left hip and left rib cage pain where she hit the door.

## 2022-10-15 ENCOUNTER — Ambulatory Visit: Payer: Medicare HMO | Admitting: Orthopaedic Surgery

## 2022-10-15 DIAGNOSIS — M79672 Pain in left foot: Secondary | ICD-10-CM | POA: Diagnosis not present

## 2022-10-15 NOTE — Progress Notes (Signed)
Office Visit Note   Patient: Christina Wolfe           Date of Birth: 02-20-1950           MRN: [id number] Visit Date: 10/15/2022              Requested by: Georgann Housekeeper, MD [address] [place],  [place] [address] PCP: Georgann Housekeeper, MD   Assessment & Plan: Visit Diagnoses:  1. Left foot pain     Plan: Christina Wolfe is 72 year old female with questionable avulsion fracture of the left fifth metatarsal base.  Symptoms have improved significantly.  At this point she can begin weaning from the walker and cam boot as symptoms allow.  She should follow-up with [place] in about 6 weeks if symptoms persist but if she feels significant improvement then she can cancel the appointment.  Questions encouraged and answered.  Follow-Up Instructions: Return in about 6 weeks (around 11/26/2022).   Orders:  No orders of the defined types were placed in this encounter.  No orders of the defined types were placed in this encounter.     Procedures: No procedures performed   Clinical Data: No additional findings.   Subjective: Chief Complaint  Patient presents with   Left Foot - Pain    HPI Christina Wolfe is a very pleasant 72 year old female here for evaluation of left foot injury.  Initially went to the urgent care on Saturday when she had a fall and rolled her foot at home.  Initially she had severe pain and was unable to weight-bear.  X-rays showed a possible avulsion fracture to the base of the fifth metatarsal.  She was placed on a walker and a cam boot.  Since then she has felt much better and describes minimal pain. Review of Systems  Constitutional: Negative.   HENT: Negative.    Eyes: Negative.   Respiratory: Negative.    Cardiovascular: Negative.   Endocrine: Negative.   Musculoskeletal: Negative.   Neurological: Negative.   Hematological: Negative.   Psychiatric/Behavioral: Negative.    All other systems reviewed and are negative.    Objective: Vital Signs: There were no  vitals taken for this visit.  Physical Exam Vitals and nursing note reviewed.  Constitutional:      Appearance: She is well-developed.  HENT:     Head: Atraumatic.     Nose: Nose normal.  Eyes:     Extraocular Movements: Extraocular movements intact.  Cardiovascular:     Pulses: Normal pulses.  Pulmonary:     Effort: Pulmonary effort is normal.  Abdominal:     Palpations: Abdomen is soft.  Musculoskeletal:     Cervical back: Neck supple.  Skin:    General: Skin is warm.     Capillary Refill: Capillary refill takes less than 2 seconds.  Neurological:     Mental Status: She is alert. Mental status is at baseline.  Psychiatric:        Behavior: Behavior normal.        Thought Content: Thought content normal.        Judgment: Judgment normal.     Ortho Exam Examination of the left foot shows minimal tenderness and swelling to the base of the fifth metatarsal.  No motor or sensory deficits. Specialty Comments:  No specialty comments available.  Imaging: No results found.   PMFS History: There are no problems to display for this patient.  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    Family History  Problem Relation Age of Onset   Breast cancer Neg Hx     Past Surgical History:  Procedure Laterality Date   CATARACT EXTRACTION, BILATERAL     COLONOSCOPY WITH PROPOFOL N/A 03/01/2016   Procedure: COLONOSCOPY WITH PROPOFOL;  Surgeon: Charolett Bumpers, MD;  Location: [facility];  Service: Endoscopy;  Laterality: N/A;   Partial historectomy N/A    Uterus removed   WRIST SURGERY Right    Social History   Occupational History   Not on file  Tobacco Use   Smoking status: Former    Current packs/day: 0.00    Average packs/day: 1 pack/day for 25.0 years (25.0 ttl pk-yrs)    Types:  Cigarettes    Start date: 03/02/1987    Quit date: 03/01/2012    Years since quitting: 10.6   Smokeless tobacco: Never  Vaping Use   Vaping status: Never Used  Substance and Sexual Activity   Alcohol use: No   Drug use: No   Sexual activity: Yes

## 2022-11-24 ENCOUNTER — Other Ambulatory Visit: Payer: Self-pay | Admitting: Internal Medicine

## 2022-11-24 DIAGNOSIS — M8588 Other specified disorders of bone density and structure, other site: Secondary | ICD-10-CM

## 2022-11-30 ENCOUNTER — Ambulatory Visit: Payer: Medicare HMO | Admitting: Orthopaedic Surgery

## 2023-01-24 DIAGNOSIS — Z23 Encounter for immunization: Secondary | ICD-10-CM | POA: Diagnosis not present

## 2023-01-24 DIAGNOSIS — N1831 Chronic kidney disease, stage 3a: Secondary | ICD-10-CM | POA: Diagnosis not present

## 2023-01-24 DIAGNOSIS — R7303 Prediabetes: Secondary | ICD-10-CM | POA: Diagnosis not present

## 2023-01-24 DIAGNOSIS — Z1331 Encounter for screening for depression: Secondary | ICD-10-CM | POA: Diagnosis not present

## 2023-01-24 DIAGNOSIS — M79672 Pain in left foot: Secondary | ICD-10-CM | POA: Diagnosis not present

## 2023-01-24 DIAGNOSIS — E782 Mixed hyperlipidemia: Secondary | ICD-10-CM | POA: Diagnosis not present

## 2023-01-24 DIAGNOSIS — K219 Gastro-esophageal reflux disease without esophagitis: Secondary | ICD-10-CM | POA: Diagnosis not present

## 2023-01-24 DIAGNOSIS — Z8544 Personal history of malignant neoplasm of other female genital organs: Secondary | ICD-10-CM | POA: Diagnosis not present

## 2023-01-24 DIAGNOSIS — E039 Hypothyroidism, unspecified: Secondary | ICD-10-CM | POA: Diagnosis not present

## 2023-01-24 DIAGNOSIS — Z0000 Encounter for general adult medical examination without abnormal findings: Secondary | ICD-10-CM | POA: Diagnosis not present

## 2023-01-24 DIAGNOSIS — M65311 Trigger thumb, right thumb: Secondary | ICD-10-CM | POA: Diagnosis not present

## 2023-01-24 DIAGNOSIS — M8588 Other specified disorders of bone density and structure, other site: Secondary | ICD-10-CM | POA: Diagnosis not present

## 2023-02-22 DIAGNOSIS — M79672 Pain in left foot: Secondary | ICD-10-CM | POA: Diagnosis not present

## 2023-02-28 DIAGNOSIS — M79672 Pain in left foot: Secondary | ICD-10-CM | POA: Diagnosis not present

## 2023-03-22 DIAGNOSIS — M65311 Trigger thumb, right thumb: Secondary | ICD-10-CM | POA: Diagnosis not present

## 2023-03-22 DIAGNOSIS — M79672 Pain in left foot: Secondary | ICD-10-CM | POA: Diagnosis not present

## 2023-06-13 ENCOUNTER — Other Ambulatory Visit: Payer: Self-pay | Admitting: Internal Medicine

## 2023-06-13 DIAGNOSIS — Z1231 Encounter for screening mammogram for malignant neoplasm of breast: Secondary | ICD-10-CM

## 2023-06-23 ENCOUNTER — Ambulatory Visit
Admission: RE | Admit: 2023-06-23 | Discharge: 2023-06-23 | Disposition: A | Discharge status: Home / Self Care | Payer: Medicare HMO | Source: Ambulatory Visit | Attending: Internal Medicine | Admitting: Internal Medicine

## 2023-06-23 DIAGNOSIS — M8588 Other specified disorders of bone density and structure, other site: Secondary | ICD-10-CM

## 2023-06-23 DIAGNOSIS — N958 Other specified menopausal and perimenopausal disorders: Secondary | ICD-10-CM | POA: Diagnosis not present

## 2023-07-22 ENCOUNTER — Ambulatory Visit
Admission: RE | Admit: 2023-07-22 | Discharge: 2023-07-22 | Disposition: A | Discharge status: Home / Self Care | Source: Ambulatory Visit | Attending: Internal Medicine | Admitting: Internal Medicine

## 2023-07-22 DIAGNOSIS — Z1231 Encounter for screening mammogram for malignant neoplasm of breast: Secondary | ICD-10-CM | POA: Diagnosis not present

## 2023-10-08 IMAGING — MG MM DIGITAL SCREENING BILAT W/ TOMO AND CAD
8 series · 8 of 24 positions shown · non-contrast
Comparison: Previous exam(s).

CLINICAL DATA: Screening.

EXAM:
DIGITAL SCREENING BILATERAL MAMMOGRAM WITH TOMOSYNTHESIS AND CAD
TECHNIQUE: Bilateral screening digital craniocaudal and mediolateral oblique
mammograms were obtained. Bilateral screening digital breast
tomosynthesis was performed. The images were evaluated with
computer-aided detection.

[R CC synth-2D]
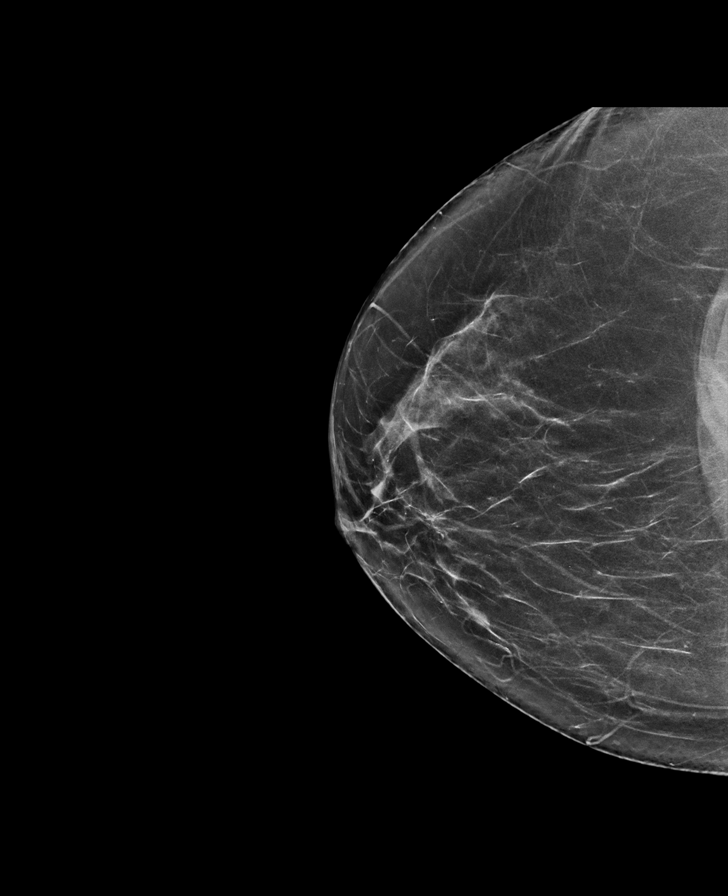

[L MLO synth-2D]
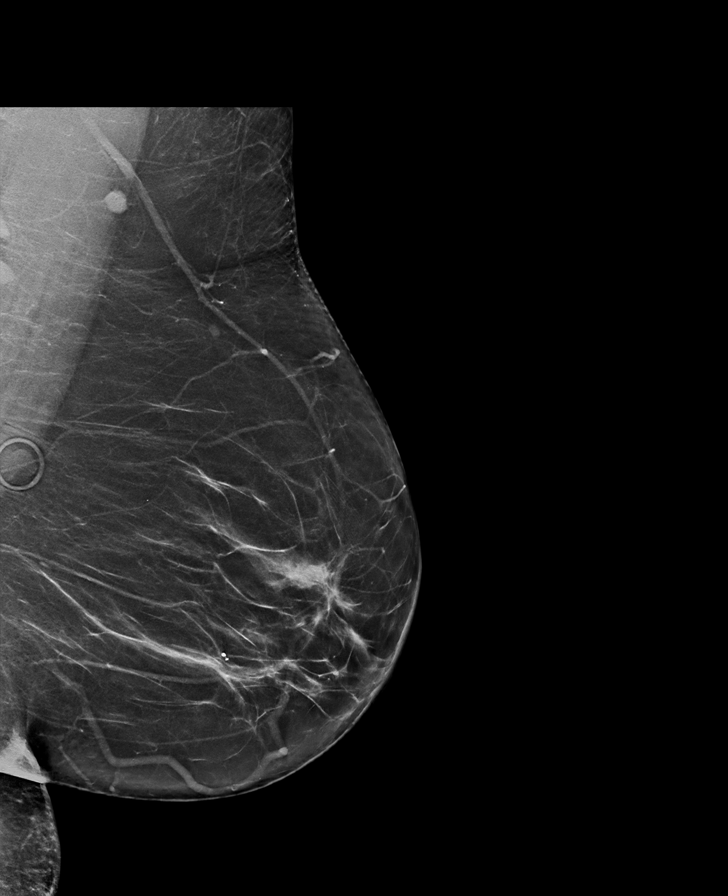

[R MLO synth-2D]
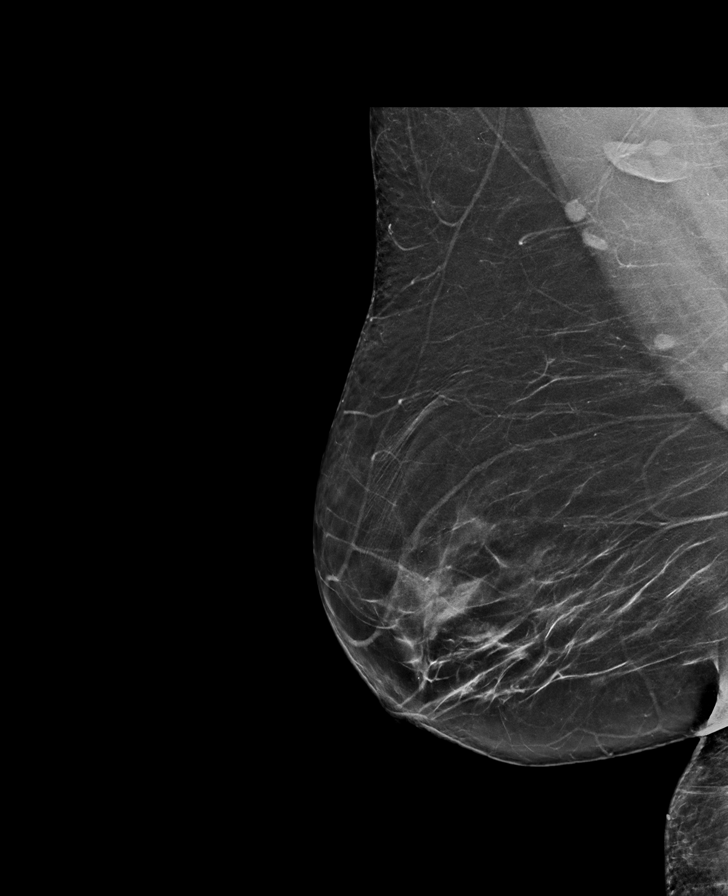

[L CC synth-2D]
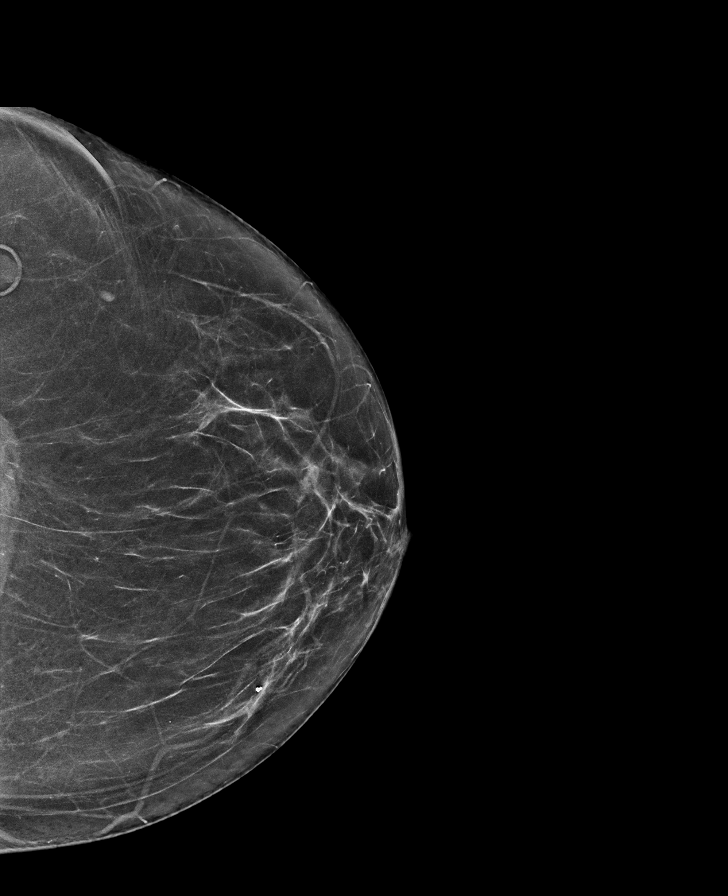

[R CC tomo · tomo slice 32/63.0]
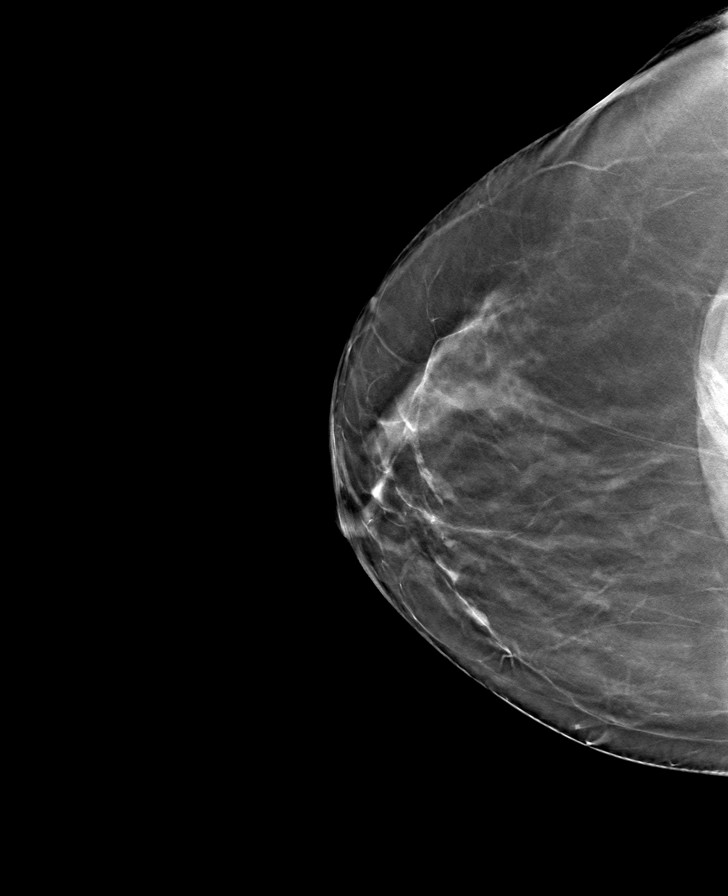

[L CC tomo · tomo slice 31/60.0]
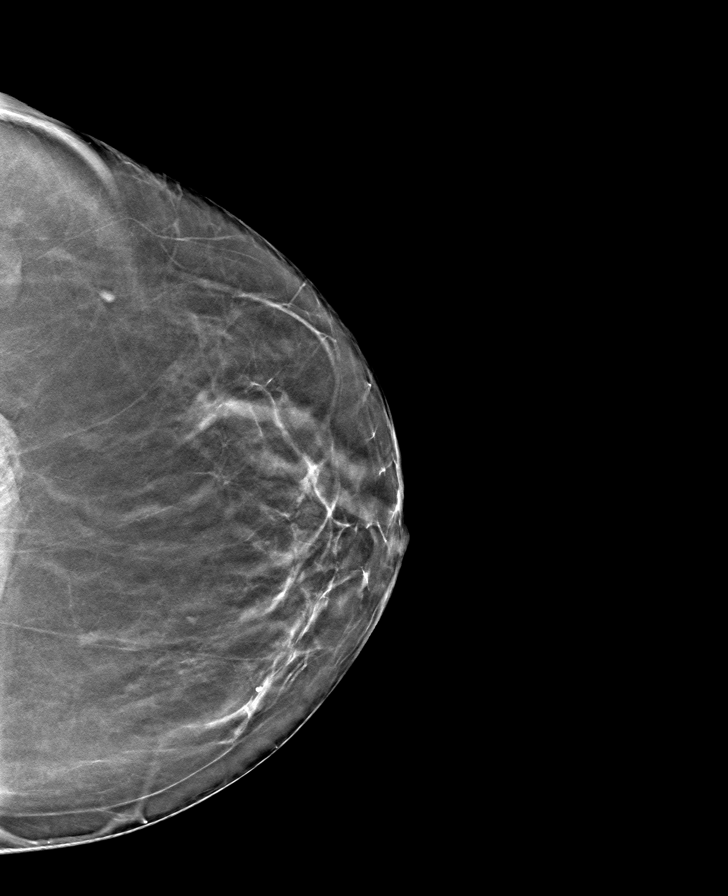

[L MLO tomo · tomo slice 36/71.0]
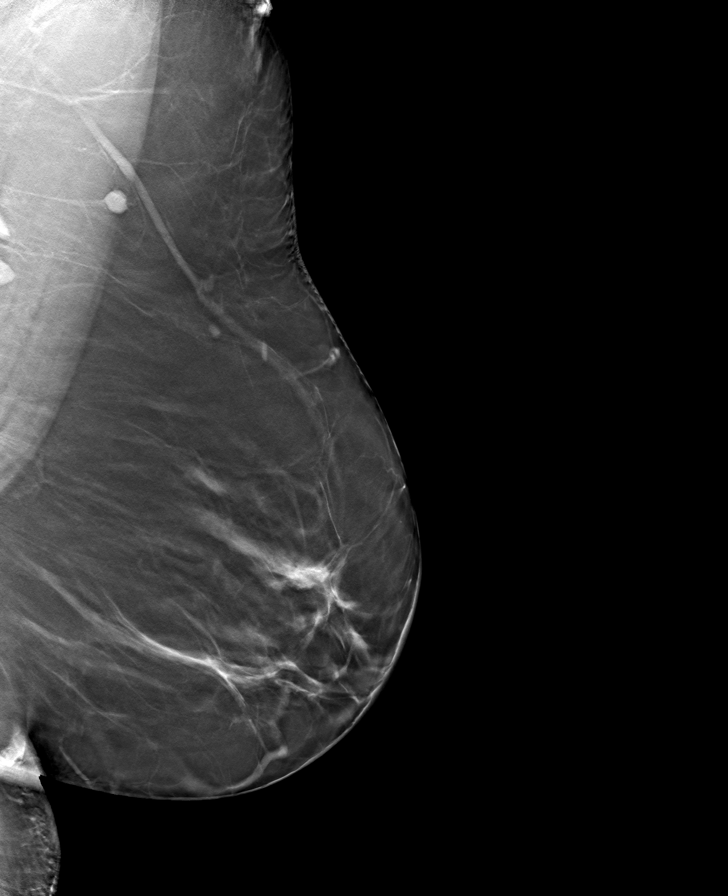

[R MLO tomo · tomo slice 35/70.0]
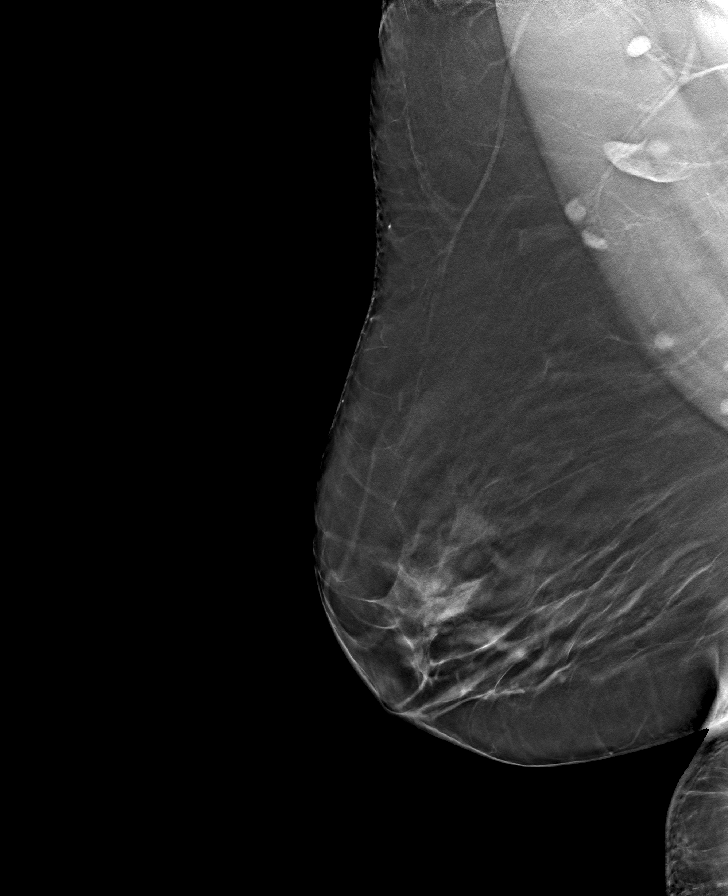

[8 of 24 positions shown; findings below may reference images not displayed]

ACR Breast Density Category b: There are scattered areas of
fibroglandular density.
FINDINGS: There are no findings suspicious for malignancy.
IMPRESSION: No mammographic evidence of malignancy. A result letter of this
screening mammogram will be mailed directly to the patient.

RECOMMENDATION:
Screening mammogram in one year. (Code:51-O-LD2)

BI-RADS CATEGORY  1: Negative.

## 2024-01-23 ENCOUNTER — Ambulatory Visit
Admission: EM | Admit: 2024-01-23 | Discharge: 2024-01-23 | Disposition: A | Discharge status: Home / Self Care | Attending: Emergency Medicine | Admitting: Emergency Medicine

## 2024-01-23 DIAGNOSIS — J069 Acute upper respiratory infection, unspecified: Secondary | ICD-10-CM

## 2024-01-23 DIAGNOSIS — J0100 Acute maxillary sinusitis, unspecified: Secondary | ICD-10-CM

## 2024-01-23 MED ORDER — BENZONATATE 100 MG PO CAPS: 100.0000 mg | ORAL_CAPSULE | Freq: Three times a day (TID) | ORAL | 0 refills | Status: AC | PRN

## 2024-01-23 MED ORDER — AMOXICILLIN-POT CLAVULANATE 875-125 MG PO TABS: 1.0000 | ORAL_TABLET | Freq: Two times a day (BID) | ORAL | 0 refills | Status: AC

## 2024-01-23 NOTE — ED Provider Notes (Signed)
 Christina Wolfe    CSN: [id number] Arrival date & time: 01/23/24  0845      History   Chief Complaint Chief Complaint  Patient presents with   Cough   Nasal Congestion    HPI Christina Wolfe is a 72 y.o. female.  Patient presents with 2-week history of congestion, sinus pressure, postnasal drip, runny nose, cough.  Treatment attempted with Tylenol  and cough medication.  No fever, chest pain, shortness of breath.  Her medical history includes diabetes.  The history is provided by the patient and medical records.    Past Medical History:  Diagnosis Date   Diabetes mellitus without complication (HCC)    GERD (gastroesophageal reflux disease)    Hypothyroidism    Paget's disease of vulva (HCC)    Paget's disease of vulva (HCC) 2014    There are no active problems to display for this patient.   Past Surgical History:  Procedure Laterality Date   CATARACT EXTRACTION, BILATERAL     COLONOSCOPY WITH PROPOFOL  N/A 03/01/2016   Procedure: COLONOSCOPY WITH PROPOFOL ;  Surgeon: Gladis MARLA Louder, MD;  Location: [facility];  Service: Endoscopy;  Laterality: N/A;   Partial historectomy N/A    Uterus removed   WRIST SURGERY Right     OB History   No obstetric history on file.      Home Medications    Prior to Admission medications   Medication Sig Start Date End Date Taking? Authorizing Provider  amoxicillin -clavulanate (AUGMENTIN ) 875-125 MG tablet Take 1 tablet by mouth every 12 (twelve) hours. 01/23/24  Yes Corlis Burnard DEL, NP  benzonatate  (TESSALON ) 100 MG capsule Take 1 capsule (100 mg total) by mouth 3 (three) times daily as needed for cough. 01/23/24  Yes Corlis Burnard DEL, NP  Calcium Carbonate-Vitamin D3 (CALCIUM 600-D) 600-400 MG-UNIT TABS Take 1 tablet by mouth daily.     [provider]  Cholecalciferol (VITAMIN D3) 2000 units TABS Take 2,000 Units by mouth daily.    [provider]  Cyanocobalamin (VITAMIN B-12 PO) Take 1 tablet by mouth daily.     [provider]  HYDROcodone -acetaminophen  (NORCO/VICODIN) 5-325 MG tablet Take 1 tablet by mouth every 6 (six) hours as needed (pain). Patient not taking: Reported on 01/23/2024 10/10/22   Banister, Rochele K, MD  levothyroxine (SYNTHROID, LEVOTHROID) 88 MCG tablet Take 88 mcg by mouth daily before breakfast.     [provider]  lovastatin (MEVACOR) 20 MG tablet Take 20 mg by mouth at bedtime.    [provider]  metformin (FORTAMET) 500 MG (OSM) 24 hr tablet Take 500 mg by mouth at bedtime.    [provider]  Omega-3 Fatty Acids (FISH OIL) 1200 MG CAPS Take 2,400 mg by mouth daily.     [provider]  pantoprazole (PROTONIX) 40 MG tablet Take 40 mg by mouth daily as needed (For heartburn or acid reflux.).     [provider]  silver  sulfADIAZINE  (SILVADENE ) 1 % cream Apply 1 Application topically daily. Patient not taking: Reported on 01/23/2024 10/10/22   Vonna Sharlet MARLA, MD    Family History Family History  Problem Relation Age of Onset   Breast cancer Neg Hx     Social History Social History   Tobacco Use   Smoking status: Former    Current packs/day: 0.00    Average packs/day: 1 pack/day for 25.0 years (25.0 ttl pk-yrs)    Types: Cigarettes    Start date: 03/02/1987    Quit  date: 03/01/2012    Years since quitting: 11.9   Smokeless tobacco: Never  Vaping Use   Vaping status: Never Used  Substance Use Topics   Alcohol use: No   Drug use: No     Allergies   Nsaids and Nexium [esomeprazole magnesium]   Review of Systems Review of Systems  Constitutional:  Negative for chills and fever.  HENT:  Positive for congestion, postnasal drip, rhinorrhea and sinus pressure. Negative for ear pain and sore throat.   Respiratory:  Positive for cough. Negative for shortness of breath.   Cardiovascular:  Negative for chest pain and palpitations.     Physical Exam Triage Vital Signs ED Triage Vitals [01/23/24 0939]   Encounter Vitals Group     BP 134/75     Girls Systolic BP Percentile      Girls Diastolic BP Percentile      Boys Systolic BP Percentile      Boys Diastolic BP Percentile      Pulse Rate 96     Resp 18     Temp 98 F (36.7 C)     Temp src      SpO2 95 %     Weight      Height      Head Circumference      Peak Flow      Pain Score      Pain Loc      Pain Education      Exclude from Growth Chart    No data found.  Updated Vital Signs BP 134/75   Pulse 96   Temp 98 F (36.7 C)   Resp 18   SpO2 95%   Visual Acuity Right Eye Distance:   Left Eye Distance:   Bilateral Distance:    Right Eye Near:   Left Eye Near:    Bilateral Near:     Physical Exam Constitutional:      General: She is not in acute distress. HENT:     Right Ear: Tympanic membrane normal.     Left Ear: Tympanic membrane normal.     Nose: Congestion and rhinorrhea present.     Mouth/Throat:     Mouth: Mucous membranes are moist.     Pharynx: Oropharynx is clear.  Cardiovascular:     Rate and Rhythm: Normal rate and regular rhythm.     Heart sounds: Normal heart sounds.  Pulmonary:     Effort: Pulmonary effort is normal. No respiratory distress.     Breath sounds: Normal breath sounds.  Neurological:     Mental Status: She is alert.      UC Treatments / Results  Labs (all labs ordered are listed, but only abnormal results are displayed) Labs Reviewed - No data to display  EKG   Radiology No results found.  Procedures Procedures (including critical care time)  Medications Ordered in UC Medications - No data to display  Initial Impression / Assessment and Plan / UC Course  I have reviewed the triage vital signs and the nursing notes.  Pertinent labs & imaging results that were available during my care of the patient were reviewed by me and considered in my medical decision making (see chart for details).    Acute sinusitis, acute upper respiratory infection.  Afebrile and  vital signs are stable.  Patient has been symptomatic for 2 weeks and is not improving with OTC treatment.  Treating today with Augmentin  and Tessalon  Perles.  Education provided on sinus infection  and URI.  Instructed patient to follow-up with her PCP if she is not improving.  She agrees to plan of care.  Final Clinical Impressions(s) / UC Diagnoses   Final diagnoses:  Acute non-recurrent maxillary sinusitis  Acute upper respiratory infection     Discharge Instructions      Take the Augmentin  and Tessalon  Perles as directed.  Follow-up with your primary care provider if your symptoms are not improving.      ED Prescriptions     Medication Sig Dispense Auth. Provider   amoxicillin -clavulanate (AUGMENTIN ) 875-125 MG tablet Take 1 tablet by mouth every 12 (twelve) hours. 14 tablet Corlis Sor H, NP   benzonatate  (TESSALON ) 100 MG capsule Take 1 capsule (100 mg total) by mouth 3 (three) times daily as needed for cough. 21 capsule Corlis Sor DEL, NP      PDMP not reviewed this encounter.   Corlis Sor DEL, NP 01/23/24 1026

## 2024-01-23 NOTE — ED Triage Notes (Signed)
 Patient to Urgent Care with complaints of productive cough/ sinus and facial pain/ nasal congestion. Symptoms x2 weeks.  Fever blister to top lip x10 days.  Using night time cough medication and tylenol .

## 2024-01-23 NOTE — Discharge Instructions (Addendum)
Take the Augmentin and Tessalon Perles as directed.  Follow up with your primary care provider if your symptoms are not improving.
